# Patient Record
Sex: Male | Born: 1955 | Race: White | Hispanic: No | Marital: Married | Smoking: Former smoker
Health system: Southern US, Community
[De-identification: ages and names within clinical notes are randomized; demographics above are authoritative.]

## PROBLEM LIST (undated history)

## (undated) HISTORY — PX: HERNIA REPAIR: SHX51

---

## 2008-04-16 ENCOUNTER — Ambulatory Visit: Payer: Self-pay | Admitting: Unknown Physician Specialty

## 2011-10-01 ENCOUNTER — Ambulatory Visit: Payer: Self-pay | Admitting: Surgery

## 2014-07-21 NOTE — Op Note (Signed)
PATIENT NAME:  Jesse Cameron, Jesse Cameron MR#:  [id number] DATE OF BIRTH:  06-12-1955  DATE OF PROCEDURE:  10/01/2011  PREOPERATIVE DIAGNOSIS: Left inguinal hernia.   POSTOPERATIVE DIAGNOSIS: Left inguinal hernia.   PROCEDURE: Left inguinal hernia repair.   SURGEON: Rochel Brome, M.D.   ANESTHESIA: General.   INDICATIONS: This 59 year old male has had recent pain and bulging in the left groin. A left inguinal hernia was demonstrated on physical exam and repair was recommended for definitive treatment.   DESCRIPTION OF PROCEDURE: The patient was placed on the operating table in the supine position under general anesthesia. The left groin was clipped and prepared with ChloraPrep and draped in a sterile manner.   A transversely oriented left suprapubic incision was made and carried down through subcutaneous tissues. One traversing vein was divided and ligated with 4-0 Vicryl. Scarpa's fascia was incised. Several small bleeding points were cauterized. The external ring was identified. The external oblique aponeurosis was incised along the course of its fibers to open the external ring and expose the inguinal cord structures. These structures and ilioinguinal nerve were mobilized with blunt finger dissection and a Penrose drain passed around the cord structures. The floor of the inguinal canal appeared to be slightly weakened. Cremaster fibers were spread to expose an indirect hernia sac which was some 2 inches in length and was dissected free from surrounding structures up to the internal ring. The sac was opened. There was attachment of the sigmoid colon to the inner aspect of the sac and this attachment was divided and the colon reduced. Next, a high ligation of the sac was done with a 0 Surgilon. The sac was excised and did not need to be sent for pathology. The floor of the inguinal canal was repaired with a row of 0 Surgilon sutures beginning at the pubic tubercle and suturing the conjoined tendon to the  shelving edge of the inguinal ligament incorporating transversalis fascia into the repair. The last stitch led to satisfactory narrowing of the internal ring. Next, an onlay Atrium mesh was cut and created an oval shape of some 2.5 x 3.5 cm in dimension. A notch was cut out to straddle the internal ring. The mesh was sewn to the repair and was also sewn medially to the fascia and on both sides of the internal ring. The repair looked good. Hemostasis was intact. The cord structures and ilioinguinal nerve were replaced along the floor of the inguinal canal. Cut edges of the external oblique aponeurosis were closed with a running 4-0 Vicryl. The deep fascia superior and lateral to the repair site was infiltrated with 0.5% Sensorcaine with epinephrine. Subcutaneous tissues were infiltrated as well. The Scarpa's fascia was closed with interrupted 4-0 Vicryl. The skin was closed with running 5-0 Monocryl subcuticular suture and Dermabond. The patient tolerated surgery satisfactorily and was then prepared for transfer to the recovery room. ____________________________ Lenna Sciara. Rochel Brome, MD jws:slb D: 10/01/2011 09:36:58 ET T: 10/01/2011 14:29:13 ET JOB#: [id number]  cc: Loreli Dollar, MD, <Dictator> Loreli Dollar MD ELECTRONICALLY SIGNED 10/02/2011 11:08

## 2014-10-04 ENCOUNTER — Encounter: Payer: Self-pay | Admitting: *Deleted

## 2014-10-07 ENCOUNTER — Encounter
Admission: RE | Disposition: A | Discharge status: Home / Self Care | Payer: Self-pay | Source: Ambulatory Visit | Attending: Unknown Physician Specialty

## 2014-10-07 ENCOUNTER — Ambulatory Visit
Admission: RE | Admit: 2014-10-07 | Discharge: 2014-10-07 | Disposition: A | Discharge status: Home / Self Care | Payer: PRIVATE HEALTH INSURANCE | Source: Ambulatory Visit | Attending: Unknown Physician Specialty | Admitting: Unknown Physician Specialty

## 2014-10-07 ENCOUNTER — Ambulatory Visit: Payer: PRIVATE HEALTH INSURANCE | Admitting: Anesthesiology

## 2014-10-07 DIAGNOSIS — Z86010 Personal history of colonic polyps: Secondary | ICD-10-CM | POA: Insufficient documentation

## 2014-10-07 DIAGNOSIS — D123 Benign neoplasm of transverse colon: Secondary | ICD-10-CM | POA: Insufficient documentation

## 2014-10-07 DIAGNOSIS — K5730 Diverticulosis of large intestine without perforation or abscess without bleeding: Secondary | ICD-10-CM | POA: Insufficient documentation

## 2014-10-07 DIAGNOSIS — Z7982 Long term (current) use of aspirin: Secondary | ICD-10-CM | POA: Insufficient documentation

## 2014-10-07 DIAGNOSIS — Z79899 Other long term (current) drug therapy: Secondary | ICD-10-CM | POA: Insufficient documentation

## 2014-10-07 DIAGNOSIS — Z87891 Personal history of nicotine dependence: Secondary | ICD-10-CM | POA: Insufficient documentation

## 2014-10-07 DIAGNOSIS — K648 Other hemorrhoids: Secondary | ICD-10-CM | POA: Insufficient documentation

## 2014-10-07 HISTORY — PX: COLONOSCOPY WITH PROPOFOL: SHX5780

## 2014-10-07 SURGERY — COLONOSCOPY WITH PROPOFOL
Anesthesia: General

## 2014-10-07 MED ORDER — PROPOFOL INFUSION 10 MG/ML OPTIME
INTRAVENOUS | Status: AC | PRN
  Administered 2014-10-07: 140 ug/kg/min via INTRAVENOUS

## 2014-10-07 MED ORDER — MIDAZOLAM HCL 2 MG/2ML IJ SOLN
INTRAMUSCULAR | Status: AC | PRN
  Administered 2014-10-07: 1 mg via INTRAVENOUS

## 2014-10-07 MED ORDER — FENTANYL CITRATE (PF) 100 MCG/2ML IJ SOLN
INTRAMUSCULAR | Status: AC | PRN
Start: 2014-10-07 — End: ?
  Administered 2014-10-07: 50 ug via INTRAVENOUS

## 2014-10-07 MED ORDER — SODIUM CHLORIDE 0.9 % IV SOLN
INTRAVENOUS | Status: DC
  Administered 2014-10-07: 1000 mL via INTRAVENOUS

## 2014-10-07 MED ORDER — EPHEDRINE SULFATE 50 MG/ML IJ SOLN
INTRAMUSCULAR | Status: AC | PRN
  Administered 2014-10-07: 5 mg via INTRAVENOUS

## 2014-10-07 MED ORDER — SODIUM CHLORIDE 0.9 % IV SOLN: INTRAVENOUS | Status: DC

## 2014-10-07 NOTE — H&P (Signed)
Primary Care Physician:  Rusty Aus., MD Primary Gastroenterologist:  Dr. Vira Agar  Pre-Procedure History & Physical: HPI:  Jesse Cameron is a 59 y.o. male is here for an colonoscopy.   History reviewed. No pertinent past medical history.  Past Surgical History  Procedure Laterality Date  . Hernia repair      Prior to Admission medications   Medication Sig Start Date End Date Taking? Authorizing Provider  aspirin 81 MG tablet Take 81 mg by mouth daily.   Yes Historical Provider, MD  sildenafil (REVATIO) 20 MG tablet Take 20 mg by mouth 3 (three) times daily.   Yes Historical Provider, MD  valACYclovir (VALTREX) 500 MG tablet Take 500 mg by mouth 2 (two) times daily.   Yes Historical Provider, MD    Allergies as of 10/04/2014  . (No Known Allergies)    History reviewed. No pertinent family history.  History   Social History  . Marital Status: Married    Spouse Name: N/A  . Number of Children: N/A  . Years of Education: N/A   Occupational History  . Not on file.   Social History Main Topics  . Smoking status: Former Research scientist (life sciences)  . Smokeless tobacco: Not on file  . Alcohol Use: Not on file  . Drug Use: Not on file  . Sexual Activity: Not on file   Other Topics Concern  . Not on file   Social History Narrative    Review of Systems: See HPI, otherwise negative ROS  Physical Exam: BP 123/90 mmHg  Pulse 78  Temp(Src) 96.9 F (36.1 C) (Tympanic)  Resp 17  Ht 6\' 5"  (1.956 m)  Wt 97.523 kg (215 lb)  BMI 25.49 kg/m2  SpO2 97% General:   Alert,  pleasant and cooperative in NAD Head:  Normocephalic and atraumatic. Neck:  Supple; no masses or thyromegaly. Lungs:  Clear throughout to auscultation.    Heart:  Regular rate and rhythm. Abdomen:  Soft, nontender and nondistended. Normal bowel sounds, without guarding, and without rebound.   Neurologic:  Alert and  oriented x4;  grossly normal neurologically.  Impression/Plan: Jesse Cameron is here for an  colonoscopy to be performed for personal history of colon polyps  Risks, benefits, limitations, and alternatives regarding  colonoscopy have been reviewed with the patient.  Questions have been answered.  All parties agreeable.   Gaylyn Cheers, MD  10/07/2014, 8:13 AM   Primary Care Physician:  Rusty Aus., MD Primary Gastroenterologist:  Dr. Vira Agar  Pre-Procedure History & Physical: HPI:  Jesse Cameron is a 59 y.o. male is here for an colonoscopy.   History reviewed. No pertinent past medical history.  Past Surgical History  Procedure Laterality Date  . Hernia repair      Prior to Admission medications   Medication Sig Start Date End Date Taking? Authorizing Provider  aspirin 81 MG tablet Take 81 mg by mouth daily.   Yes Historical Provider, MD  sildenafil (REVATIO) 20 MG tablet Take 20 mg by mouth 3 (three) times daily.   Yes Historical Provider, MD  valACYclovir (VALTREX) 500 MG tablet Take 500 mg by mouth 2 (two) times daily.   Yes Historical Provider, MD    Allergies as of 10/04/2014  . (No Known Allergies)    History reviewed. No pertinent family history.  History   Social History  . Marital Status: Married    Spouse Name: N/A  . Number of Children: N/A  . Years of Education: N/A   Occupational  History  . Not on file.   Social History Main Topics  . Smoking status: Former Research scientist (life sciences)  . Smokeless tobacco: Not on file  . Alcohol Use: Not on file  . Drug Use: Not on file  . Sexual Activity: Not on file   Other Topics Concern  . Not on file   Social History Narrative    Review of Systems: See HPI, otherwise negative ROS  Physical Exam: BP 123/90 mmHg  Pulse 78  Temp(Src) 96.9 F (36.1 C) (Tympanic)  Resp 17  Ht 6\' 5"  (1.956 m)  Wt 97.523 kg (215 lb)  BMI 25.49 kg/m2  SpO2 97% General:   Alert,  pleasant and cooperative in NAD Head:  Normocephalic and atraumatic. Neck:  Supple; no masses or thyromegaly. Lungs:  Clear throughout to  auscultation.    Heart:  Regular rate and rhythm. Abdomen:  Soft, nontender and nondistended. Normal bowel sounds, without guarding, and without rebound.   Neurologic:  Alert and  oriented x4;  grossly normal neurologically.  Impression/Plan: Jesse Cameron is here for an colonoscopy to be performed for personal history of colon polyps  Risks, benefits, limitations, and alternatives regarding  colonoscopy have been reviewed with the patient.  Questions have been answered.  All parties agreeable.   Gaylyn Cheers, MD  10/07/2014, 8:13 AM

## 2014-10-07 NOTE — Anesthesia Procedure Notes (Signed)
Performed by: COOK-MARTIN, Axxel Gude Pre-anesthesia Checklist: Patient identified, Emergency Drugs available, Suction available, Patient being monitored and Timeout performed Patient Re-evaluated:Patient Re-evaluated prior to inductionOxygen Delivery Method: Nasal cannula Preoxygenation: Pre-oxygenation with 100% oxygen Intubation Type: IV induction     

## 2014-10-07 NOTE — Transfer of Care (Signed)
Immediate Anesthesia Transfer of Care Note  Patient: Jesse Cameron  Procedure(s) Performed: Procedure(s): COLONOSCOPY WITH PROPOFOL (N/A)  Patient Location: PACU  Anesthesia Type:General  Level of Consciousness: awake, alert  and oriented  Airway & Oxygen Therapy: Patient Spontanous Breathing and Patient connected to nasal cannula oxygen  Post-op Assessment: Report given to RN and Post -op Vital signs reviewed and stable  Post vital signs: rreviewed and stable  Last Vitals:  Filed Vitals:   10/07/14 0727  BP: 123/90  Pulse: 78  Temp: 36.1 C  Resp: 17    Complications: No apparent anesthesia complications

## 2014-10-07 NOTE — Op Note (Signed)
[facility] Gastroenterology Patient Name: Jesse Cameron Procedure Date: 10/07/2014 8:14 AM MRN: [id number] Account #: [id number] Date of Birth: 1955/09/06 Admit Type: Outpatient Age: 59 Room: [facility] ENDO ROOM 1 Gender: Male Note Status: Finalized Procedure:         Colonoscopy Indications:       Personal history of colonic polyps Providers:         Manya Silvas, MD Referring MD:      Rusty Aus, MD (Referring MD) Medicines:         Propofol per Anesthesia Complications:     No immediate complications. Procedure:         Pre-Anesthesia Assessment:                    - After reviewing the risks and benefits, the patient was                     deemed in satisfactory condition to undergo the procedure.                    After obtaining informed consent, the colonoscope was                     passed under direct vision. Throughout the procedure, the                     patient's blood pressure, pulse, and oxygen saturations                     were monitored continuously. The Colonoscope was                     introduced through the anus and advanced to the the cecum,                     identified by appendiceal orifice and ileocecal valve. The                     colonoscopy was performed without difficulty. The patient                     tolerated the procedure well. The quality of the bowel                     preparation was good. Findings:      A small polyp was found in the transverse colon. The polyp was sessile.       The polyp was removed with a hot snare. Resection was complete, but the       polyp tissue was not retrieved.      A diminutive polyp was found in the transverse colon. The polyp was       sessile. The polyp was removed with a jumbo cold forceps. Resection and       retrieval were complete.      A few small-mouthed diverticula were found in the descending colon.      Internal hemorrhoids were found during endoscopy. The  hemorrhoids were       medium-sized and Grade I (internal hemorrhoids that do not prolapse).      The exam was otherwise without abnormality. Impression:        - One small polyp in the transverse colon. Complete  resection. Polyp tissue not retrieved.                    - One diminutive polyp in the transverse colon. Resected                     and retrieved.                    - Diverticulosis in the descending colon.                    - Internal hemorrhoids.                    - The examination was otherwise normal. Recommendation:    - Await pathology results. Manya Silvas, MD 10/07/2014 8:43:49 AM This report has been signed electronically. Number of Addenda: 0 Note Initiated On: 10/07/2014 8:14 AM Scope Withdrawal Time: 0 hours 10 minutes 31 seconds  Total Procedure Duration: 0 hours 22 minutes 1 second       [facility]

## 2014-10-07 NOTE — Anesthesia Postprocedure Evaluation (Signed)
  Anesthesia Post-op Note  Patient: Jesse Cameron  Procedure(s) Performed: Procedure(s): COLONOSCOPY WITH PROPOFOL (N/A)  Anesthesia type:General  Patient location: PACU  Post pain: Pain level controlled  Post assessment: Post-op Vital signs reviewed, Patient's Cardiovascular Status Stable, Respiratory Function Stable, Patent Airway and No signs of Nausea or vomiting  Post vital signs: Reviewed and stable  Last Vitals:  Filed Vitals:   10/07/14 0845  BP:   Pulse:   Temp: 36.4 C  Resp:     Level of consciousness: awake, alert  and patient cooperative  Complications: No apparent anesthesia complications

## 2014-10-07 NOTE — Anesthesia Preprocedure Evaluation (Signed)
Anesthesia Evaluation  Patient identified by MRN, date of birth, ID band Patient awake    Reviewed: Allergy & Precautions, NPO status , Patient's Chart, lab work & pertinent test results  History of Anesthesia Complications Negative for: history of anesthetic complications  Airway Mallampati: II       Dental no notable dental hx.    Pulmonary former smoker,    + decreased breath sounds      Cardiovascular negative cardio ROS Normal cardiovascular exam    Neuro/Psych negative neurological ROS  negative psych ROS   GI/Hepatic negative GI ROS, Neg liver ROS,   Endo/Other  negative endocrine ROS  Renal/GU negative Renal ROS  negative genitourinary   Musculoskeletal negative musculoskeletal ROS (+)   Abdominal Normal abdominal exam  (+)   Peds negative pediatric ROS (+)  Hematology negative hematology ROS (+)   Anesthesia Other Findings   Reproductive/Obstetrics negative OB ROS                             Anesthesia Physical Anesthesia Plan  ASA: II  Anesthesia Plan: General   Post-op Pain Management:    Induction: Intravenous  Airway Management Planned: Nasal Cannula  Additional Equipment:   Intra-op Plan:   Post-operative Plan:   Informed Consent: I have reviewed the patients History and Physical, chart, labs and discussed the procedure including the risks, benefits and alternatives for the proposed anesthesia with the patient or authorized representative who has indicated his/her understanding and acceptance.     Plan Discussed with: CRNA  Anesthesia Plan Comments:         Anesthesia Quick Evaluation

## 2014-10-08 ENCOUNTER — Encounter: Payer: Self-pay | Admitting: Unknown Physician Specialty

## 2014-10-09 LAB — SURGICAL PATHOLOGY

## 2020-10-22 ENCOUNTER — Other Ambulatory Visit: Payer: Self-pay | Admitting: Internal Medicine

## 2020-10-22 ENCOUNTER — Ambulatory Visit
Admission: RE | Admit: 2020-10-22 | Discharge: 2020-10-22 | Disposition: A | Discharge status: Home / Self Care | Payer: 59 | Source: Ambulatory Visit | Attending: Family Medicine | Admitting: Family Medicine

## 2020-10-22 ENCOUNTER — Other Ambulatory Visit (HOSPITAL_COMMUNITY): Payer: Self-pay | Admitting: Internal Medicine

## 2020-10-22 ENCOUNTER — Other Ambulatory Visit: Payer: Self-pay | Admitting: Family Medicine

## 2020-10-22 ENCOUNTER — Other Ambulatory Visit: Payer: Self-pay

## 2020-10-22 DIAGNOSIS — M79604 Pain in right leg: Secondary | ICD-10-CM

## 2020-10-22 DIAGNOSIS — R1031 Right lower quadrant pain: Secondary | ICD-10-CM

## 2020-10-22 DIAGNOSIS — I83891 Varicose veins of right lower extremities with other complications: Secondary | ICD-10-CM

## 2020-10-23 ENCOUNTER — Other Ambulatory Visit: Payer: Self-pay | Admitting: Internal Medicine

## 2020-10-23 DIAGNOSIS — R1031 Right lower quadrant pain: Secondary | ICD-10-CM

## 2020-10-31 ENCOUNTER — Ambulatory Visit: Payer: 59

## 2020-11-06 ENCOUNTER — Other Ambulatory Visit: Payer: Self-pay

## 2020-11-06 ENCOUNTER — Ambulatory Visit
Admission: RE | Admit: 2020-11-06 | Discharge: 2020-11-06 | Disposition: A | Discharge status: Home / Self Care | Payer: 59 | Source: Ambulatory Visit | Attending: Internal Medicine | Admitting: Internal Medicine

## 2020-11-06 DIAGNOSIS — R1031 Right lower quadrant pain: Secondary | ICD-10-CM | POA: Insufficient documentation

## 2020-11-06 MED ORDER — IOHEXOL 350 MG/ML SOLN
100.0000 mL | Freq: Once | INTRAVENOUS | Status: AC | PRN
  Administered 2020-11-06: 100 mL via INTRAVENOUS

## 2022-04-06 DIAGNOSIS — Z0000 Encounter for general adult medical examination without abnormal findings: Secondary | ICD-10-CM | POA: Diagnosis not present

## 2022-04-06 DIAGNOSIS — Z125 Encounter for screening for malignant neoplasm of prostate: Secondary | ICD-10-CM | POA: Diagnosis not present

## 2022-11-09 IMAGING — US US EXTREM LOW VENOUS*R*
1 series · 13 of 24 positions shown · non-contrast
Comparison: None.

CLINICAL DATA: Right lower extremity pain.



[Series 1: us venous img lower uni right (dvt) · portal-venous · 49 acquisitions, 13 frames shown]
[im 1/49]
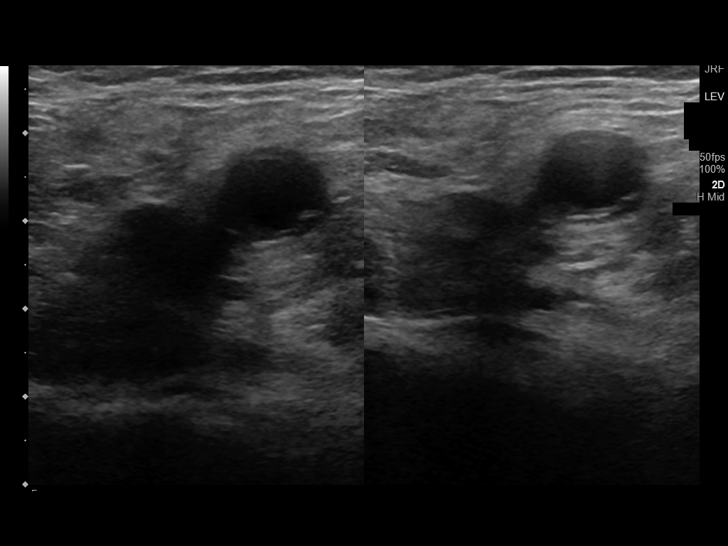
[im 5/49]
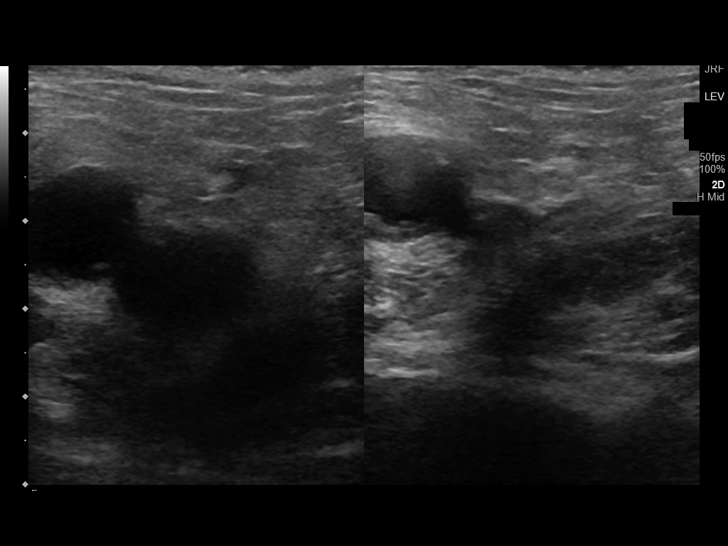
[im 9/49]
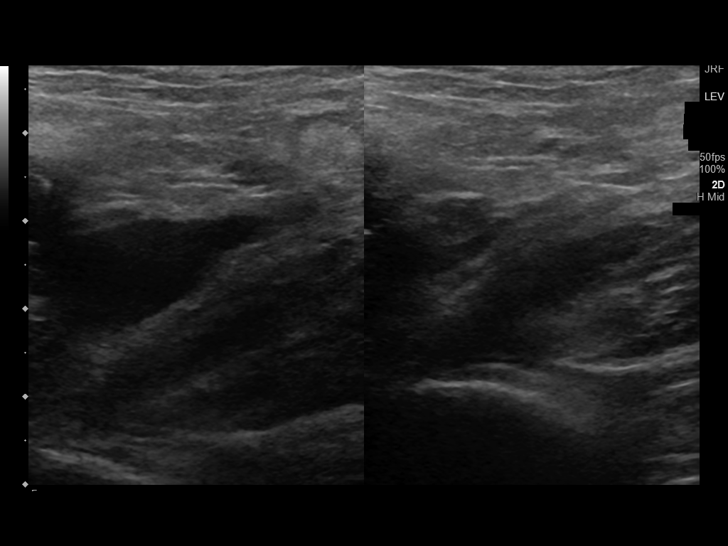
[im 13/49]
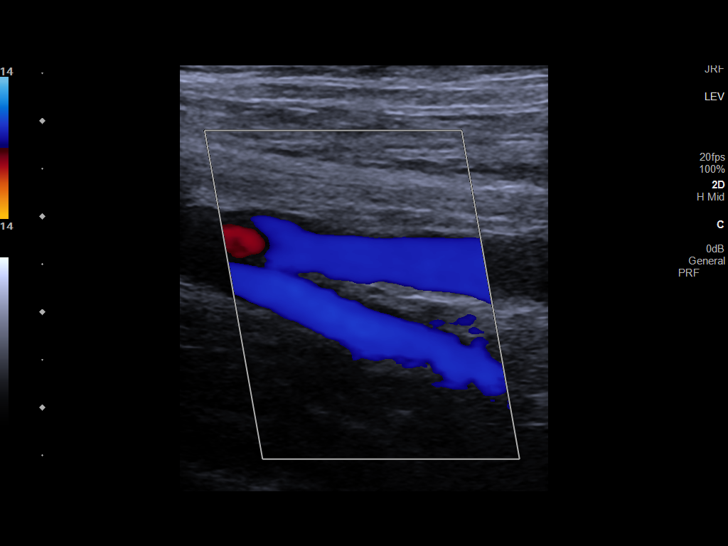
[im 17/49]
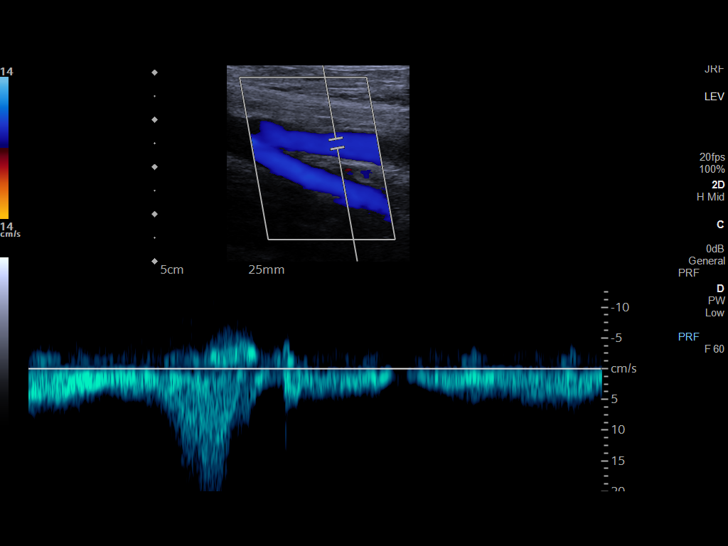
[im 21/49]
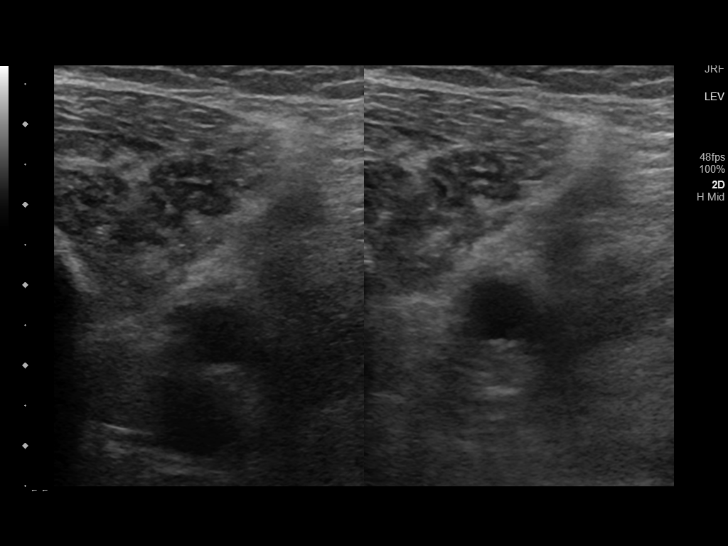
[im 28/49]
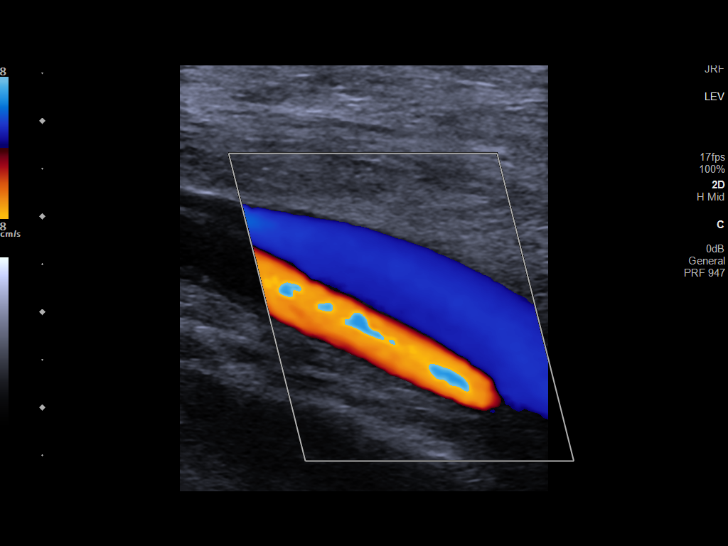
[im 30/49]
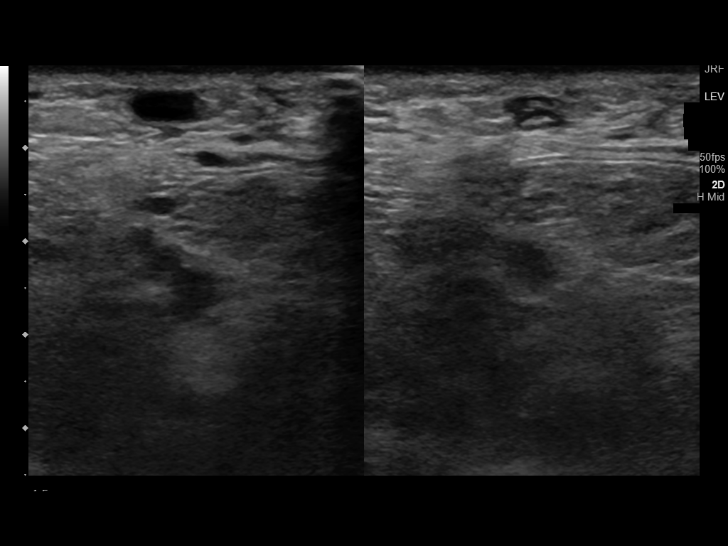
[im 34/49]
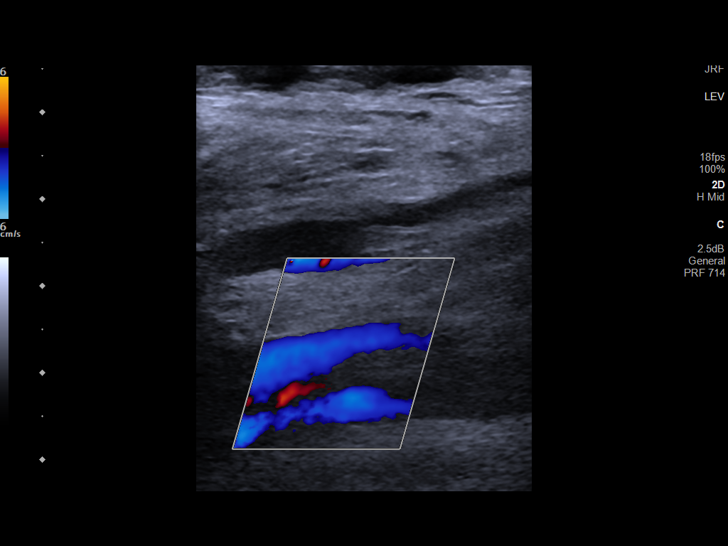
[im 38/49]
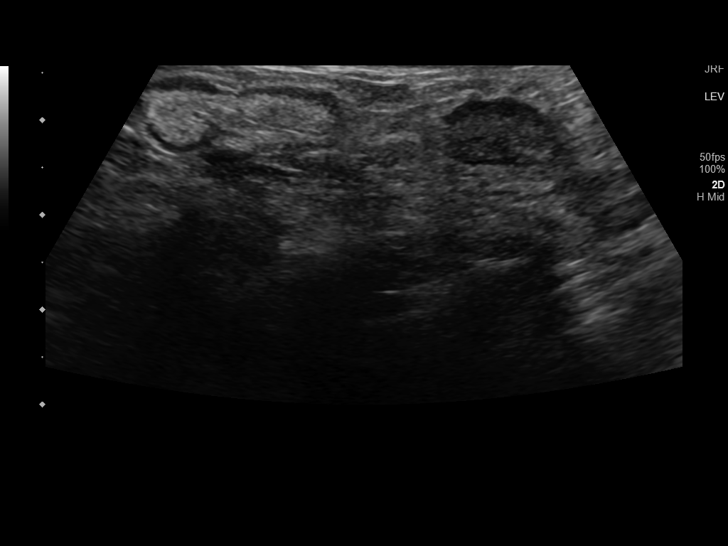
[im 42/49]
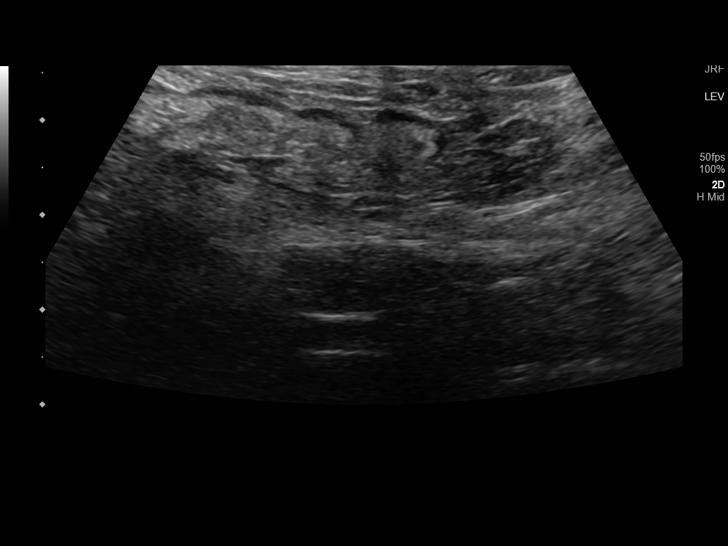
[im 46/49]
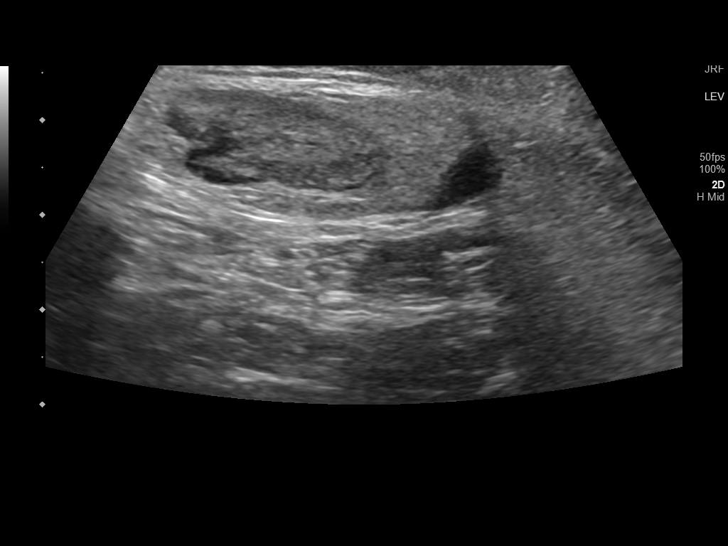
[im 49/49]
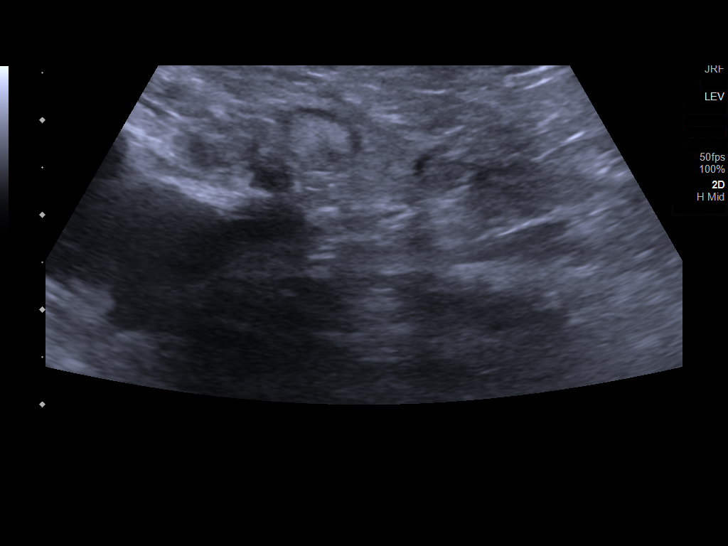

[13 of 24 positions shown; findings below may reference images not displayed]

FINDINGS: Contralateral Common Femoral Vein: Respiratory phasicity is normal
and symmetric with the symptomatic side. No evidence of thrombus.
Normal compressibility.

Common Femoral Vein: No evidence of thrombus. Normal
compressibility, respiratory phasicity and response to augmentation.

Saphenofemoral Junction: No evidence of thrombus. Normal
compressibility and flow on color Doppler imaging.

Profunda Femoral Vein: No evidence of thrombus. Normal
compressibility and flow on color Doppler imaging.

Femoral Vein: No evidence of thrombus. Normal compressibility,
respiratory phasicity and response to augmentation.

Popliteal Vein: No evidence of thrombus. Normal compressibility,
respiratory phasicity and response to augmentation.

Calf Veins: No evidence of thrombus. Normal compressibility and flow
on color Doppler imaging.

Superficial Great Saphenous Vein: No evidence of thrombus. Normal
compressibility.

Venous Reflux:  None.

Other Findings: No evidence of superficial thrombophlebitis or
abnormal fluid collection. Mildly prominent inguinal lymph nodes on
the right may be reactive. The largest measures approximately 1.5 cm
in short axis.
IMPRESSION: No evidence of right lower extremity deep venous thrombosis.

## 2022-11-24 IMAGING — CT CT ABD-PELV W/ CM
2 of 5 series · 16 of 46 positions shown, 18 images · IV contrast (omnipaque)
Comparison: None

CLINICAL DATA: RIGHT lower leg pain with redness and swelling for 1
month, prior LEFT inguinal hernia repair, history ethanol use, no
DVT by ultrasound

EXAM:
CT ABDOMEN AND PELVIS WITH CONTRAST
TECHNIQUE: Multidetector CT imaging of the abdomen and pelvis was performed
using the standard protocol following bolus administration of
intravenous contrast. Sagittal and coronal MPR images reconstructed
from axial data set.
CONTRAST:  100mL OMNIPAQUE IOHEXOL 350 MG/ML SOLN IV. Dilute oral
contrast.

[Series 2: abd pelvis 5.00 · axial · 0.77mm/px · z∈[-1563,-1123]mm · 13 of 100 slices shown, 15 images]
[im 6/100  soft-tissue]
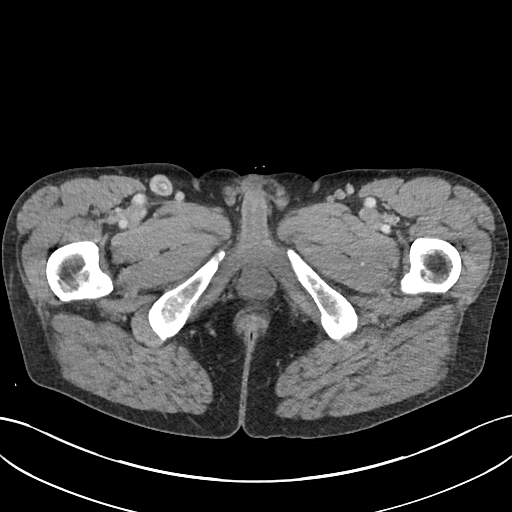
[im 6/100  bone]
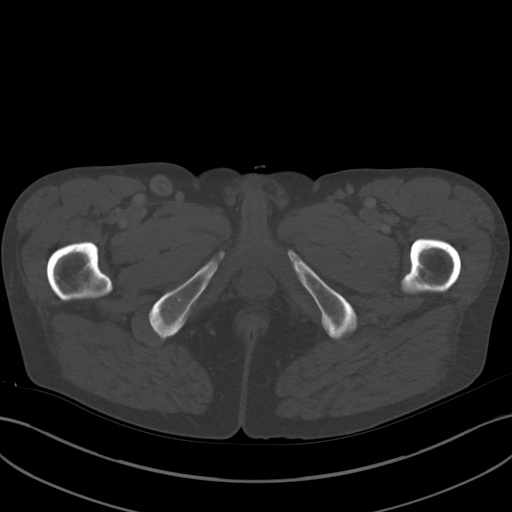
[im 12/100  soft-tissue]
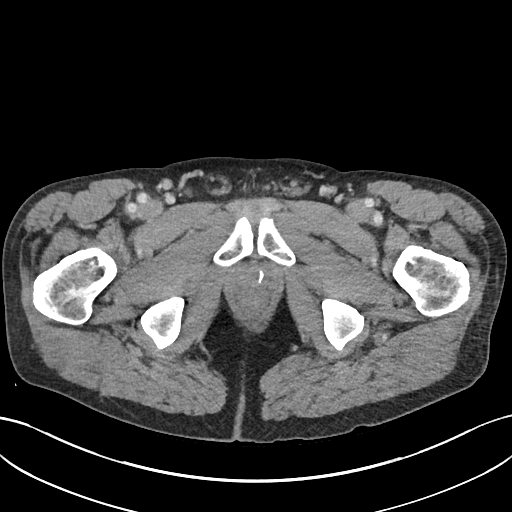
[im 24/100  soft-tissue]
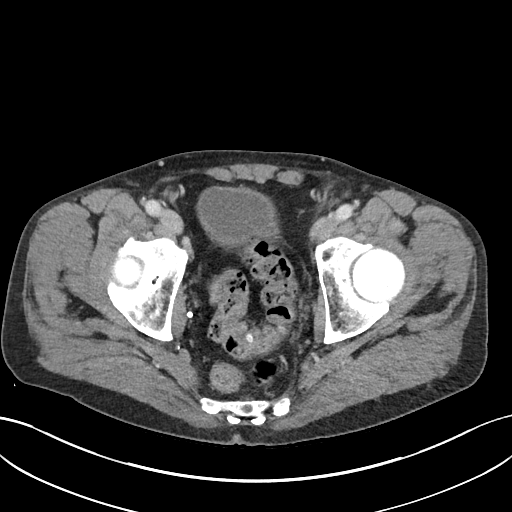
[im 30/100  soft-tissue]
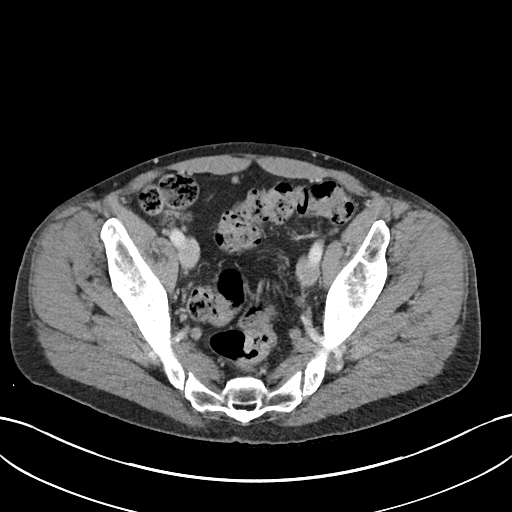
[im 35/100  soft-tissue]
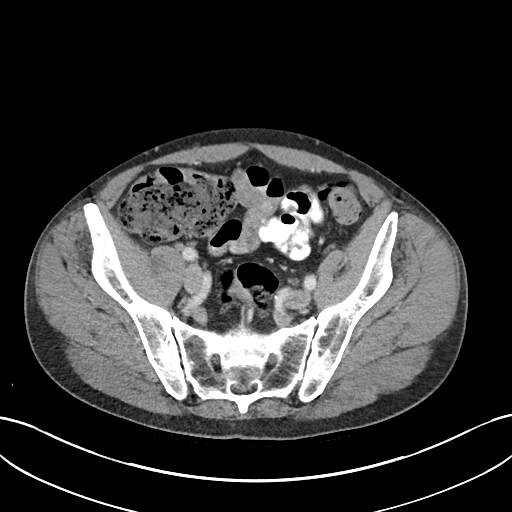
[im 41/100  soft-tissue]
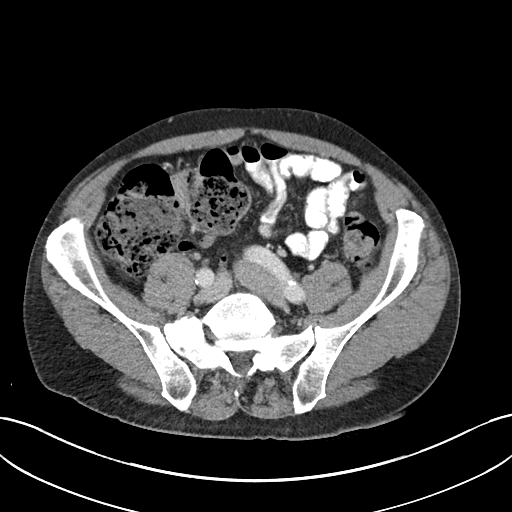
[im 53/100  soft-tissue]
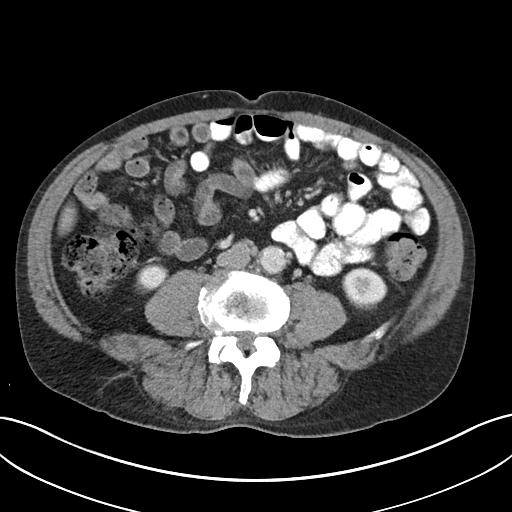
[im 59/100  soft-tissue]
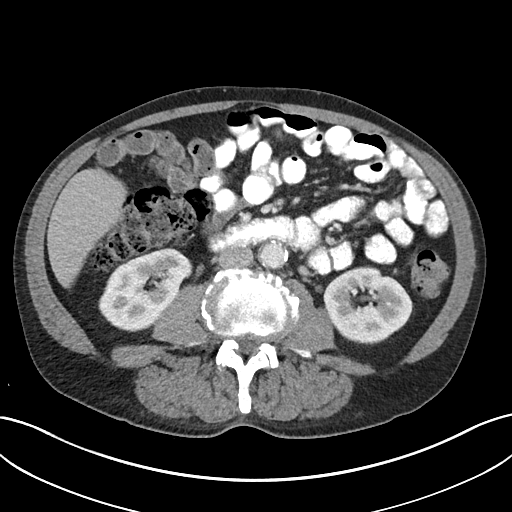
[im 65/100  soft-tissue]
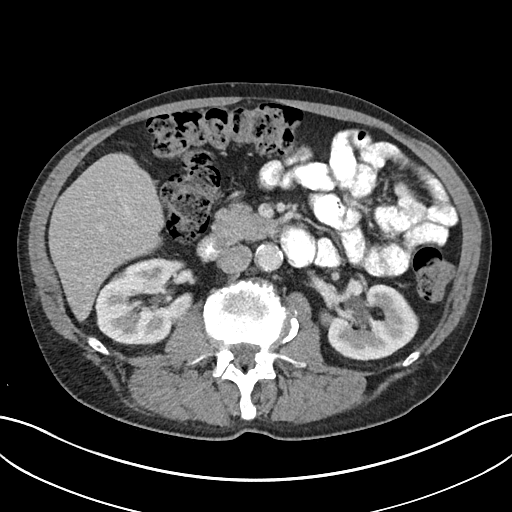
[im 65/100  bone]
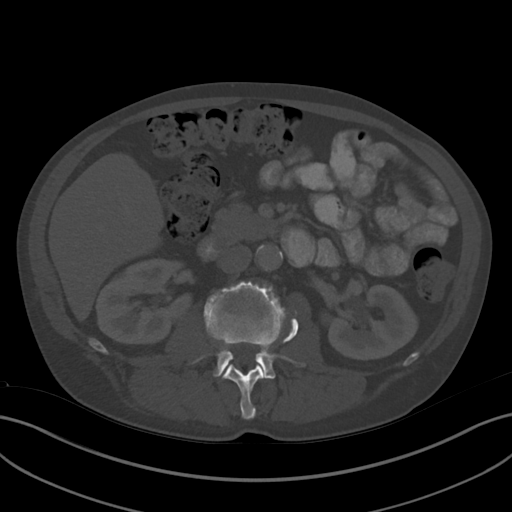
[im 70/100  soft-tissue]
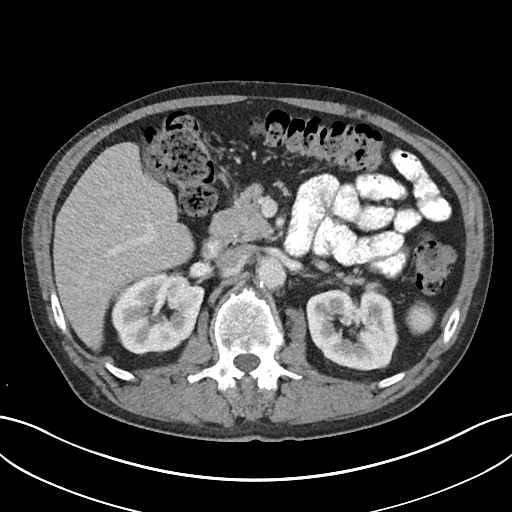
[im 76/100  soft-tissue]
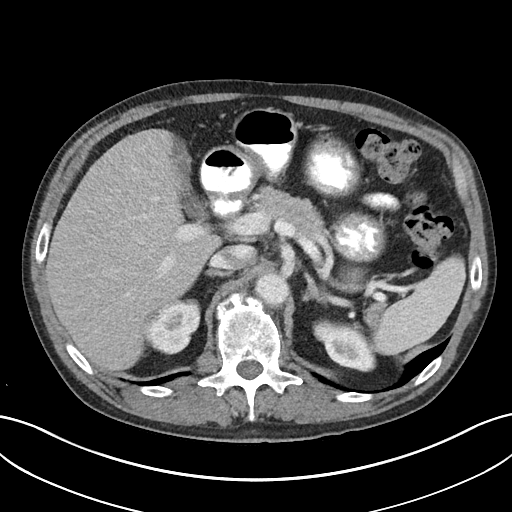
[im 88/100  soft-tissue]
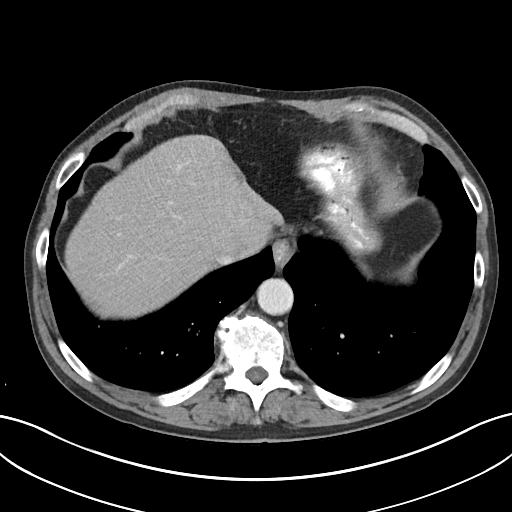
[im 94/100  soft-tissue]
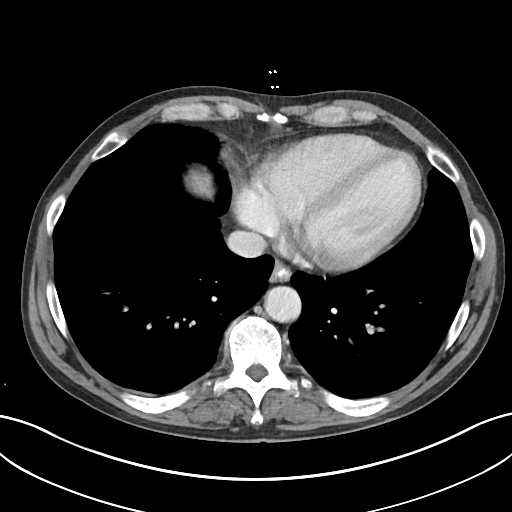

[Series 4: coronals abd pelvis 2.00 cor · coronal · 0.77mm/px · 3 of 145 slices shown]
[im 49/145  soft-tissue]
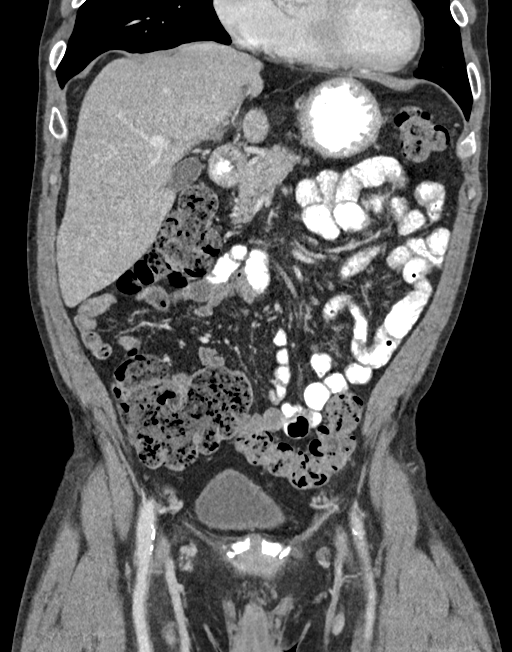
[im 65/145  soft-tissue]
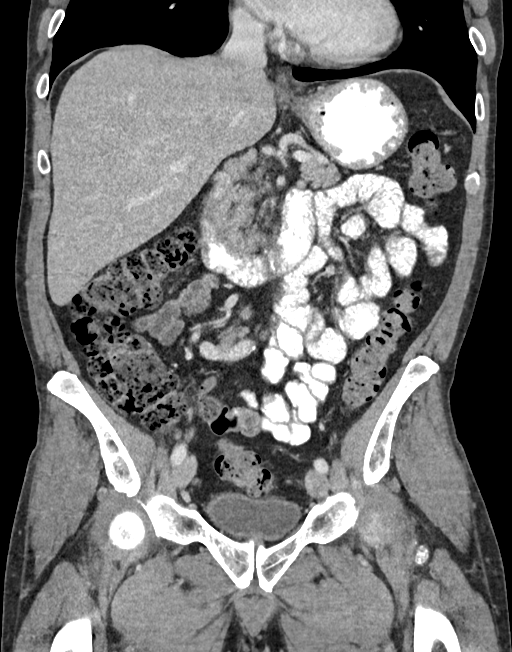
[im 81/145  soft-tissue]
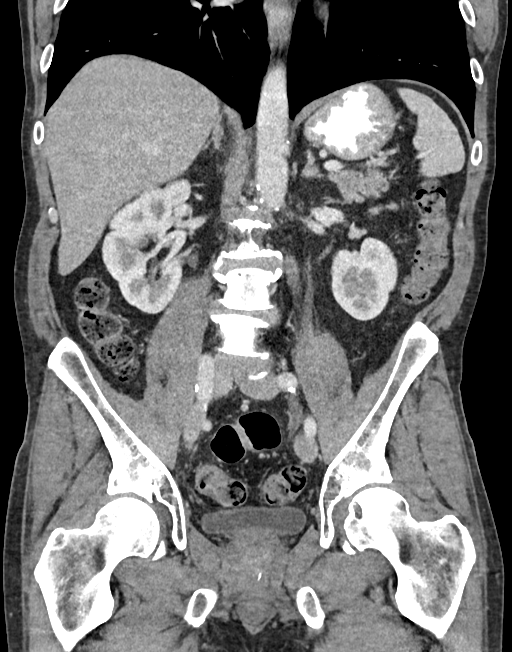

[16 of 46 positions shown; findings below may reference images not displayed]

FINDINGS: Lower chest: Lung bases clear

Hepatobiliary: Gallbladder and liver normal appearance

Pancreas: Normal appearance

Spleen: Normal appearance.  Tiny splenule.

Adrenals/Urinary Tract: Adrenal glands, kidneys, ureters, and
bladder normal appearance

Stomach/Bowel: Normal appendix. Stool throughout colon. Minimal
sigmoid diverticulosis. Stomach unremarkable. Bowel loops otherwise
normal appearance.

Vascular/Lymphatic: Atherosclerotic calcifications aorta, iliac
arteries, femoral arteries. Aorta normal caliber. Single mildly
enlarged lymph node at RIGHT inguinal region 1.6 cm short axis.

Reproductive: Prominent seminal vesicles. Prostate gland
unremarkable.

Other: No free air or free fluid. No definite hernia or infant acute
inflammatory process.

Musculoskeletal: Degenerative disc disease changes lumbar spine.
IMPRESSION: No acute intra-abdominal or intrapelvic abnormalities.

Minimal sigmoid diverticulosis.

Single mildly enlarged RIGHT inguinal lymph node 1.6 cm short axis,
nonspecific.

Aortic Atherosclerosis ([id number]).

## 2023-12-05 ENCOUNTER — Other Ambulatory Visit (INDEPENDENT_AMBULATORY_CARE_PROVIDER_SITE_OTHER): Payer: Self-pay | Admitting: Nurse Practitioner

## 2023-12-05 DIAGNOSIS — I83891 Varicose veins of right lower extremities with other complications: Secondary | ICD-10-CM

## 2023-12-07 ENCOUNTER — Encounter (INDEPENDENT_AMBULATORY_CARE_PROVIDER_SITE_OTHER): Payer: Self-pay | Admitting: Nurse Practitioner

## 2023-12-07 ENCOUNTER — Ambulatory Visit (INDEPENDENT_AMBULATORY_CARE_PROVIDER_SITE_OTHER): Payer: Self-pay | Admitting: Nurse Practitioner

## 2023-12-07 ENCOUNTER — Other Ambulatory Visit (INDEPENDENT_AMBULATORY_CARE_PROVIDER_SITE_OTHER): Payer: Self-pay

## 2023-12-07 VITALS — BP 131/84 | HR 75 | Ht 77.0 in | Wt 204.8 lb

## 2023-12-07 DIAGNOSIS — I83891 Varicose veins of right lower extremities with other complications: Secondary | ICD-10-CM | POA: Diagnosis not present

## 2023-12-07 DIAGNOSIS — L97312 Non-pressure chronic ulcer of right ankle with fat layer exposed: Secondary | ICD-10-CM

## 2023-12-07 DIAGNOSIS — I83013 Varicose veins of right lower extremity with ulcer of ankle: Secondary | ICD-10-CM

## 2023-12-07 NOTE — Progress Notes (Signed)
 Subjective:    Patient ID: Jesse Cameron, male    DOB: Jul 30, 1955, 68 y.o.   MRN: [id number] Chief Complaint  Patient presents with   New Patient (Initial Visit)     consult. Varicose veins of right lower ext    Patient is seen for evaluation of leg pain and swelling associated with new onset ulceration. The patient first noticed the swelling remotely. The swelling is associated with pain and discoloration. The pain and swelling worsens with prolonged dependency and improves with elevation. The pain is unrelated to activity.  The patient notes that in the morning the legs are better but the leg symptoms worsened throughout the course of the day. The patient has also noted a progressive worsening of the discoloration in the ankle and shin area.   The patient notes that an ulcer has developed acutely without specific trauma and since it occurred it has been very slow to heal.  There is a moderate amount of drainage associated with the open area.  The wound is also very painful.  The patient denies claudication symptoms or rest pain symptoms.   The patient has not had any past angiography, interventions or vascular surgery.   Elevation makes the leg symptoms better, dependency makes them much worse. The patient denies any recent changes in medications.  The patient has been in [employer] since 09/30/2023  The patient denies a history of DVT or PE. There is no prior history of phlebitis. There is no history of primary lymphedema.  No history of malignancies. No history of trauma or groin or pelvic surgery. There is no history of radiation treatment to the groin or pelvis    Today non invasive studies indicate no evidence of DVT in the right lower extremity.  There is no chronic venous insufficiency noted.  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       Review  of Systems  Skin:  Positive for wound.  All other systems reviewed and are negative.      Objective:   Physical Exam Vitals reviewed.  HENT:     Head: Normocephalic.  Cardiovascular:     Rate and Rhythm: Normal rate.     Pulses: Normal pulses.  Pulmonary:     Effort: Pulmonary effort is normal.  Musculoskeletal:     Right lower leg: Edema present.  Skin:    General: Skin is warm and dry.  Neurological:     Mental Status: He is alert and oriented to person, place, and time.  Psychiatric:        Mood and Affect: Mood normal.        Behavior: Behavior normal.        Thought Content: Thought content normal.        Judgment: Judgment normal.     BP 131/84   Pulse 75   Ht 6' 5 (1.956 m)   Wt 204 lb 12.8 oz (92.9 kg)   BMI 24.29 kg/m   History reviewed. No pertinent past medical history.  Social History   Socioeconomic History   Marital status: Married    Spouse name: Not on file   Number of children: Not on file   Years of education: Not on file   Highest education level: Not on file  Occupational History   Not on file  Tobacco Use   Smoking status: Former   Smokeless  tobacco: Not on file  Substance and Sexual Activity   Alcohol use: Yes    Comment: weekends   Drug use: Not on file   Sexual activity: Not on file  Other Topics Concern   Not on file  Social History Narrative   Not on file   Social Drivers of Health   Financial Resource Strain: Low Risk  (04/11/2023)   Received from [facility]   Overall Financial Resource Strain (CARDIA)    Difficulty of Paying Living Expenses: Not hard at all  Food Insecurity: No Food Insecurity (04/11/2023)   Received from [facility]   Hunger Vital Sign    Within the past 12 months, you worried that your food would run out before you got the money to buy more.: Never true    Within the past 12 months, the food you bought just didn't last and you didn't have money to get more.: Never  true  Transportation Needs: No Transportation Needs (04/11/2023)   Received from [facility] - Transportation    In the past 12 months, has lack of transportation kept you from medical appointments or from getting medications?: No    Lack of Transportation (Non-Medical): No  Physical Activity: Not on file  Stress: Not on file  Social Connections: Not on file  Intimate Partner Violence: Not on file    Past Surgical History:  Procedure Laterality Date   COLONOSCOPY WITH PROPOFOL  N/A 10/07/2014   Procedure: COLONOSCOPY WITH PROPOFOL ;  Surgeon: Lamar ONEIDA Holmes, MD;  Location: [facility] ENDOSCOPY;  Service: Endoscopy;  Laterality: N/A;   HERNIA REPAIR      History reviewed. No pertinent family history.  No Known Allergies      No data to display            CMP  No results found for: NA, K, CL, CO2, GLUCOSE, BUN, CREATININE, CALCIUM, PROT, ALBUMIN, AST, ALT, ALKPHOS, BILITOT, GFR, EGFR, GFRNONAA   No results found.     Assessment & Plan:   1. 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 Patient will continue  wearing graduated compression stockings class 1 on a daily basis, beginning first thing in the morning and removing them in the evening.  The patient is CEAP [id number].  The patient has been wearing compression for more than 12 weeks with no or little benefit.  The patient has been exercising daily for more than 12 weeks. The patient has been elevating and taking OTC pain medications for more than 12 weeks.  None of these have have eliminated the pain related to the varicose veins and venous reflux or the discomfort regarding venous congestion.    In addition, behavioral modification including elevation during the day was again discussed and  this will continue.  The patient has utilized over the counter pain medications and has been exercising.  However, at this time conservative therapy has not alleviated the patient's symptoms of leg pain and swelling  Recommend: laser ablation of the right  great saphenous veins to eliminate the symptoms of pain and swelling of the lower extremities caused by the severe superficial venous reflux disease.    Current Outpatient Medications on File Prior to Visit  Medication Sig Dispense Refill   sildenafil (REVATIO) 20 MG tablet Take 20 mg  by mouth 3 (three) times daily.     torsemide (DEMADEX) 5 MG tablet Take 5 mg by mouth.     valACYclovir (VALTREX) 500 MG tablet Take 500 mg by mouth 2 (two) times daily.     aspirin 81 MG tablet Take 81 mg by mouth daily. (Patient not taking: Reported on 12/07/2023)     hydrOXYzine (ATARAX) 25 MG tablet Take by mouth.     Current Facility-Administered Medications on File Prior to Visit  Medication Dose Route Frequency Provider Last Rate Last Admin   ePHEDrine  injection    Anesthesia Intra-op Cook-Martin, Cindy   5 mg at 10/07/14 0815   fentaNYL  (SUBLIMAZE ) injection    Anesthesia Intra-op Cook-Martin, Cindy   50 mcg at 10/07/14 0815   midazolam  (VERSED ) injection    Anesthesia Intra-op Cook-Martin, Cindy   1 mg at 10/07/14 0815   propofol  (DIPRIVAN ) infusion 10 mg/ml EMUL    Continuous PRN Cook-Martin, Cindy   Stopped at 10/07/14 0830    There are no Patient Instructions on file for this visit. No follow-ups on file.   Ashleyanne Hemmingway E Lowell Makara, NP

## 2023-12-14 ENCOUNTER — Other Ambulatory Visit (INDEPENDENT_AMBULATORY_CARE_PROVIDER_SITE_OTHER): Payer: Self-pay

## 2023-12-14 ENCOUNTER — Encounter (INDEPENDENT_AMBULATORY_CARE_PROVIDER_SITE_OTHER): Payer: Self-pay | Admitting: Nurse Practitioner

## 2023-12-14 ENCOUNTER — Ambulatory Visit (INDEPENDENT_AMBULATORY_CARE_PROVIDER_SITE_OTHER): Admitting: Nurse Practitioner

## 2023-12-14 ENCOUNTER — Telehealth (INDEPENDENT_AMBULATORY_CARE_PROVIDER_SITE_OTHER): Payer: Self-pay | Admitting: Vascular Surgery

## 2023-12-14 ENCOUNTER — Other Ambulatory Visit (INDEPENDENT_AMBULATORY_CARE_PROVIDER_SITE_OTHER): Payer: Self-pay | Admitting: Nurse Practitioner

## 2023-12-14 VITALS — BP 133/77 | HR 73 | Resp 16 | Ht 77.0 in | Wt 204.8 lb

## 2023-12-14 DIAGNOSIS — I83892 Varicose veins of left lower extremities with other complications: Secondary | ICD-10-CM | POA: Diagnosis not present

## 2023-12-14 DIAGNOSIS — I83891 Varicose veins of right lower extremities with other complications: Secondary | ICD-10-CM

## 2023-12-14 NOTE — Telephone Encounter (Signed)
 done

## 2023-12-14 NOTE — Telephone Encounter (Signed)
 Pt is scheduled for a right leg GSV laser ablation with Dr. Marea on 10.3.25. Pt will need the standard RX protocol called in to [facility] in [place]. Thank you.

## 2023-12-14 NOTE — Progress Notes (Addendum)
 History of Present Illness  There is no documented history at this time  Assessments & Plan   There are no diagnoses linked to this encounter.    Additional instructions  Subjective:  Patient presents with venous ulcer of the right lower extremity.    Procedure:  3 layer unna wrap was placed to the right lower extremity.   Plan:   Follow up in one week.    Patient presented for unna wrap, lower right inner ankle had an odor, per pt he had completed antibiotic therapy 2 weeks prior under Dr Cleotilde. Per NP Orvin Daring culture of wound taken and sent out on today, wound cleansed with normal saline, xeroform applied and rewrapped with 3 layer unna boot. Pt to return in 1 wk.  Dois Seip CMA

## 2023-12-15 ENCOUNTER — Other Ambulatory Visit (INDEPENDENT_AMBULATORY_CARE_PROVIDER_SITE_OTHER): Payer: Self-pay | Admitting: Nurse Practitioner

## 2023-12-15 MED ORDER — ALPRAZOLAM 0.5 MG PO TABS: ORAL_TABLET | ORAL | 0 refills | Status: AC

## 2023-12-15 MED ORDER — SULFAMETHOXAZOLE-TRIMETHOPRIM 800-160 MG PO TABS: 1.0000 | ORAL_TABLET | Freq: Two times a day (BID) | ORAL | 0 refills | Status: AC

## 2023-12-20 LAB — AEROBIC CULTURE

## 2023-12-21 ENCOUNTER — Ambulatory Visit (INDEPENDENT_AMBULATORY_CARE_PROVIDER_SITE_OTHER): Admitting: Nurse Practitioner

## 2023-12-21 VITALS — BP 148/86 | HR 69

## 2023-12-21 DIAGNOSIS — I83891 Varicose veins of right lower extremities with other complications: Secondary | ICD-10-CM

## 2023-12-21 MED ORDER — AMOXICILLIN 875 MG PO TABS: 875.0000 mg | ORAL_TABLET | Freq: Two times a day (BID) | ORAL | 0 refills | Status: AC

## 2023-12-21 NOTE — Progress Notes (Signed)
   Subjective:  Patient presents with venous ulcer of the Right lower extremity.    Procedure:  3 layer unna wrap was placed Right lower extremity.   Plan:   Follow up in one week.   Pt presents for zinc unna wrap for right leg. Ulcer still present and weeping with some odor. Per NP pt will be sent in an alternative antibiotic today to [facility]. Site cleansed, covered with xeroform dressing and rewrapped with 3 layer Unna. Pt aware and verbalized understanding to check pharmacy for his medication on this evening and begin that antibiotic in place of prior prescribed antibiotic. Pt to follow up again in 1 week.   Dois Seip CMA

## 2023-12-25 ENCOUNTER — Encounter (INDEPENDENT_AMBULATORY_CARE_PROVIDER_SITE_OTHER): Payer: Self-pay | Admitting: Nurse Practitioner

## 2023-12-28 ENCOUNTER — Ambulatory Visit (INDEPENDENT_AMBULATORY_CARE_PROVIDER_SITE_OTHER): Admitting: Nurse Practitioner

## 2023-12-28 ENCOUNTER — Encounter (INDEPENDENT_AMBULATORY_CARE_PROVIDER_SITE_OTHER): Payer: Self-pay

## 2023-12-28 VITALS — BP 117/70 | HR 79 | Resp 16 | Ht 77.0 in | Wt 202.8 lb

## 2023-12-28 DIAGNOSIS — L84 Corns and callosities: Secondary | ICD-10-CM | POA: Diagnosis not present

## 2023-12-28 NOTE — Progress Notes (Signed)
 History of Present Illness  There is no documented history at this time  Assessments & Plan   There are no diagnoses linked to this encounter.    Additional instructions  Subjective:  Patient presents with venous ulcer of the Right lower extremity.    Procedure:  3 layer unna wrap was placed Right lower extremity.  Pt presented for unna of right leg, ulcer cleansed with normal saline covered with Aquacel, 3 layer zinc unna wrap applied. Pt reports he will finish his antibiotic on this Friday.   Plan:   Follow up in one week.    Jesse Cameron CMA

## 2023-12-30 ENCOUNTER — Ambulatory Visit (INDEPENDENT_AMBULATORY_CARE_PROVIDER_SITE_OTHER): Admitting: Vascular Surgery

## 2023-12-30 VITALS — BP 145/90 | HR 77 | Resp 16 | Ht 77.0 in | Wt 204.8 lb

## 2023-12-30 DIAGNOSIS — L97909 Non-pressure chronic ulcer of unspecified part of unspecified lower leg with unspecified severity: Secondary | ICD-10-CM | POA: Insufficient documentation

## 2023-12-30 DIAGNOSIS — L97211 Non-pressure chronic ulcer of right calf limited to breakdown of skin: Secondary | ICD-10-CM | POA: Diagnosis not present

## 2023-12-30 DIAGNOSIS — I83012 Varicose veins of right lower extremity with ulcer of calf: Secondary | ICD-10-CM | POA: Diagnosis not present

## 2023-12-30 NOTE — Progress Notes (Signed)
  Jesse Cameron is a 68 y.o. male who presents with symptomatic venous reflux  No past medical history on file.  Past Surgical History:  Procedure Laterality Date   COLONOSCOPY WITH PROPOFOL  N/A 10/07/2014   Procedure: COLONOSCOPY WITH PROPOFOL ;  Surgeon: Lamar ONEIDA Holmes, MD;  Location: [facility] ENDOSCOPY;  Service: Endoscopy;  Laterality: N/A;   HERNIA REPAIR       Current Outpatient Medications:    ALPRAZolam  (XANAX ) 0.5 MG tablet, Take 1 tablet 1 hour before procedure then take 2nd tablet once arrived at the office, Disp: 2 tablet, Rfl: 0   amoxicillin  (AMOXIL ) 875 MG tablet, Take 1 tablet (875 mg total) by mouth 2 (two) times daily., Disp: 20 tablet, Rfl: 0   aspirin 81 MG tablet, Take 81 mg by mouth daily., Disp: , Rfl:    hydrOXYzine (ATARAX) 25 MG tablet, Take by mouth., Disp: , Rfl:    sildenafil (REVATIO) 20 MG tablet, Take 20 mg by mouth 3 (three) times daily., Disp: , Rfl:    sulfamethoxazole -trimethoprim  (BACTRIM  DS) 800-160 MG tablet, Take 1 tablet by mouth 2 (two) times daily., Disp: 20 tablet, Rfl: 0   torsemide (DEMADEX) 5 MG tablet, Take 5 mg by mouth., Disp: , Rfl:    valACYclovir (VALTREX) 500 MG tablet, Take 500 mg by mouth 2 (two) times daily., Disp: , Rfl:  No current facility-administered medications for this visit.  Facility-Administered Medications Ordered in Other Visits:    ePHEDrine  injection, , , Anesthesia Intra-op, Cook-Martin, Cindy, 5 mg at 10/07/14 0815   fentaNYL  (SUBLIMAZE ) injection, , , Anesthesia Intra-op, Cook-Martin, Cindy, 50 mcg at 10/07/14 0815   midazolam  (VERSED ) injection, , , Anesthesia Intra-op, Cook-Martin, Cindy, 1 mg at 10/07/14 0815   propofol  (DIPRIVAN ) infusion 10 mg/ml EMUL, , , Continuous PRN, Cook-Martin, Cindy, Stopped at 10/07/14 0830  No Known Allergies   Varicose veins of lower extremities with ulcer (HCC)   PLAN: The patient's right lower extremity was sterilely prepped and draped. The ultrasound machine was used to  visualize the saphenous vein throughout its course. A segment in the upper calf was selected for access. The saphenous vein was accessed without difficulty using ultrasound guidance with a micropuncture needle. A 0.018 wire was then placed beyond the saphenofemoral junction and the needle was removed. The 65 cm sheath was then placed over the wire and the wire and dilator were removed. The laser fiber was then placed through the sheath and its tip was placed approximately 4-5 centimeters below the saphenofemoral junction. Tumescent anesthesia was then created with a dilute lidocaine solution. Laser energy was then delivered with constant withdrawal of the sheath and laser fiber. Approximately 1737 joules of energy were delivered over a length of 39 centimeters using a 1470 Hz VenaCure machine at 7 W. Sterile dressings were placed. The patient tolerated the procedure well without obvious complications.   Follow-up in 1 week with post-laser duplex.

## 2024-01-04 ENCOUNTER — Encounter (INDEPENDENT_AMBULATORY_CARE_PROVIDER_SITE_OTHER)

## 2024-01-05 ENCOUNTER — Other Ambulatory Visit (INDEPENDENT_AMBULATORY_CARE_PROVIDER_SITE_OTHER): Payer: Self-pay | Admitting: Vascular Surgery

## 2024-01-05 DIAGNOSIS — I83012 Varicose veins of right lower extremity with ulcer of calf: Secondary | ICD-10-CM

## 2024-01-06 ENCOUNTER — Ambulatory Visit (INDEPENDENT_AMBULATORY_CARE_PROVIDER_SITE_OTHER): Admitting: Nurse Practitioner

## 2024-01-06 ENCOUNTER — Other Ambulatory Visit (INDEPENDENT_AMBULATORY_CARE_PROVIDER_SITE_OTHER)

## 2024-01-06 DIAGNOSIS — L97211 Non-pressure chronic ulcer of right calf limited to breakdown of skin: Secondary | ICD-10-CM | POA: Diagnosis not present

## 2024-01-06 DIAGNOSIS — I83012 Varicose veins of right lower extremity with ulcer of calf: Secondary | ICD-10-CM | POA: Diagnosis not present

## 2024-01-06 NOTE — Progress Notes (Signed)
 History of Present Illness  There is no documented history at this time  Assessments & Plan   There are no diagnoses linked to this encounter.    Additional instructions  Subjective:  Patient presents with venous ulcer of the Right lower extremity.    Procedure:  3 layer unna wrap was placed Right lower extremity.  Zinc unna wrap applied ulcer has greatly improved covered with Aquacel and 3 layer unna applied   Plan:   Follow up in one week.

## 2024-01-10 ENCOUNTER — Ambulatory Visit: Admitting: Podiatry

## 2024-01-10 ENCOUNTER — Encounter: Payer: Self-pay | Admitting: Podiatry

## 2024-01-10 ENCOUNTER — Encounter (INDEPENDENT_AMBULATORY_CARE_PROVIDER_SITE_OTHER): Payer: Self-pay

## 2024-01-10 VITALS — Ht 77.0 in | Wt 204.0 lb

## 2024-01-10 DIAGNOSIS — D2372 Other benign neoplasm of skin of left lower limb, including hip: Secondary | ICD-10-CM

## 2024-01-13 ENCOUNTER — Ambulatory Visit (INDEPENDENT_AMBULATORY_CARE_PROVIDER_SITE_OTHER): Admitting: Vascular Surgery

## 2024-01-13 VITALS — BP 139/82 | HR 73 | Resp 18 | Ht 77.0 in | Wt 204.6 lb

## 2024-01-13 DIAGNOSIS — I83013 Varicose veins of right lower extremity with ulcer of ankle: Secondary | ICD-10-CM

## 2024-01-13 DIAGNOSIS — L97311 Non-pressure chronic ulcer of right ankle limited to breakdown of skin: Secondary | ICD-10-CM | POA: Insufficient documentation

## 2024-01-13 NOTE — Progress Notes (Signed)
 MRN : [id number]  Jesse Cameron is a 68 y.o. (1956-01-04) male who presents with chief complaint of  Chief Complaint  Patient presents with   Follow-up    unna  .  History of Present Illness: Patient returns today in follow up of venous insufficiency and stasis ulceration of the right ankle.  He underwent laser ablation about 2 weeks ago with a successful laser ablation of the right great saphenous vein.  He is done well from this.  He has some mild soreness and bruising on the inner thigh area but this is largely resolved.  Duplex showed this to be successful postoperatively.  He has a small clean ulceration on the right medial ankle just below the malleolus.  This seems to be healing well but we are doing weekly Unna boots.  This is no longer significantly painful.  Not erythematous.  No drainage.  Current Outpatient Medications  Medication Sig Dispense Refill   ALPRAZolam  (XANAX ) 0.5 MG tablet Take 1 tablet 1 hour before procedure then take 2nd tablet once arrived at the office 2 tablet 0   amoxicillin  (AMOXIL ) 875 MG tablet Take 1 tablet (875 mg total) by mouth 2 (two) times daily. 20 tablet 0   aspirin 81 MG tablet Take 81 mg by mouth daily.     hydrOXYzine (ATARAX) 25 MG tablet Take by mouth.     sildenafil (REVATIO) 20 MG tablet Take 20 mg by mouth 3 (three) times daily.     sulfamethoxazole -trimethoprim  (BACTRIM  DS) 800-160 MG tablet Take 1 tablet by mouth 2 (two) times daily. 20 tablet 0   torsemide (DEMADEX) 5 MG tablet Take 5 mg by mouth.     valACYclovir (VALTREX) 500 MG tablet Take 500 mg by mouth 2 (two) times daily.     No current facility-administered medications for this visit.   Facility-Administered Medications Ordered in Other Visits  Medication Dose Route Frequency Provider Last Rate Last Admin   ePHEDrine  injection    Anesthesia Intra-op Cook-Martin, Cindy   5 mg at 10/07/14 0815   fentaNYL  (SUBLIMAZE ) injection    Anesthesia Intra-op Cook-Martin, Cindy   50  mcg at 10/07/14 0815   midazolam  (VERSED ) injection    Anesthesia Intra-op Cook-Martin, Cindy   1 mg at 10/07/14 0815   propofol  (DIPRIVAN ) infusion 10 mg/ml EMUL    Continuous PRN Cook-Martin, Cindy   Stopped at 10/07/14 0830    No past medical history on file.  Past Surgical History:  Procedure Laterality Date   COLONOSCOPY WITH PROPOFOL  N/A 10/07/2014   Procedure: COLONOSCOPY WITH PROPOFOL ;  Surgeon: Jesse ONEIDA Holmes, MD;  Location: [facility] ENDOSCOPY;  Service: Endoscopy;  Laterality: N/A;   HERNIA REPAIR       Social History   Tobacco Use   Smoking status: Former  Substance Use Topics   Alcohol use: Yes    Comment: weekends       No family history on file.   No Known Allergies   REVIEW OF SYSTEMS (Negative unless checked)  Constitutional: [] Weight loss  [] Fever  [] Chills Cardiac: [] Chest pain   [] Chest pressure   [] Palpitations   [] Shortness of breath when laying flat   [] Shortness of breath at rest   [] Shortness of breath with exertion. Vascular:  [] Pain in legs with walking   [] Pain in legs at rest   [] Pain in legs when laying flat   [] Claudication   [] Pain in feet when walking  [] Pain in feet at rest  [] Pain in feet when  laying flat   [] History of DVT   [] Phlebitis   [x] Swelling in legs   [] Varicose veins   [x] Non-healing ulcers Pulmonary:   [] Uses home oxygen   [] Productive cough   [] Hemoptysis   [] Wheeze  [] COPD   [] Asthma Neurologic:  [] Dizziness  [] Blackouts   [] Seizures   [] History of stroke   [] History of TIA  [] Aphasia   [] Temporary blindness   [] Dysphagia   [] Weakness or numbness in arms   [] Weakness or numbness in legs Musculoskeletal:  [] Arthritis   [] Joint swelling   [] Joint pain   [] Low back pain Hematologic:  [] Easy bruising  [] Easy bleeding   [] Hypercoagulable state   [] Anemic   Gastrointestinal:  [] Blood in stool   [] Vomiting blood  [] Gastroesophageal reflux/heartburn   [] Abdominal pain Genitourinary:  [] Chronic kidney disease   [] Difficult urination   [] Frequent urination  [] Burning with urination   [] Hematuria Skin:  [] Rashes   [x] Ulcers   [x] Wounds Psychological:  [] History of anxiety   []  History of major depression.  Physical Examination  BP 139/82   Pulse 73   Resp 18   Ht 6' 5 (1.956 m)   Wt 204 lb 9.6 oz (92.8 kg)   BMI 24.26 kg/m  Gen:  WD/WN, NAD.  Tall and thin. Head: [place]/AT, No temporalis wasting. Ear/Nose/Throat: Hearing grossly intact, nares w/o erythema or drainage Eyes: Conjunctiva clear. Sclera non-icteric Neck: Supple.  Trachea midline Pulmonary:  Good air movement, no use of accessory muscles.  Cardiac: RRR, no JVD Vascular:  Vessel Right Left  Radial Palpable Palpable       Musculoskeletal: M/S 5/5 throughout.  No deformity or atrophy.  Small, dime size ulceration on the medial right ankle just below the malleolus with no erythema or drainage today.  This is relatively superficial.  Trace right lower extremity edema. Neurologic: Sensation grossly intact in extremities.  Symmetrical.  Speech is fluent.  Psychiatric: Judgment intact, Mood & affect appropriate for pt's clinical situation.       Labs Recent Results (from the past 2160 hours)  Aerobic culture     Status: Abnormal   Collection Time: 12/14/23  9:45 AM  Result Value Ref Range   Aerobic Bacterial Culture Final report (A)    Organism ID, Bacteria Comment (A)     Comment: Beta hemolytic Streptococcus, group B Heavy growth Penicillin and ampicillin are drugs of choice for treatment of beta-hemolytic streptococcal infections. Susceptibility testing of penicillins and other beta-lactam agents approved by the FDA for treatment of beta-hemolytic streptococcal infections need not be performed routinely because nonsusceptible isolates are extremely rare in any beta-hemolytic streptococcus and have not been reported for Streptococcus pyogenes (group A). (CLSI)    Organism ID, Bacteria Routine flora     Comment: Scant growth    Radiology VAS  US  LOWER EXTREMITY VENOUS POST ABLATION Result Date: 01/13/2024  Lower Venous DVT Study Patient Name:  Jesse Cameron  Date of Exam:   01/06/2024 Medical Rec #: [id number]          Accession #:    [id number] Date of Birth: 1955-11-15           Patient Gender: M Patient Age:   50 years Exam Location:  [facility] Procedure:      VAS US  LOWER EXTREMITY VENOUS (DVT) Referring Phys: SELINDA GU --------------------------------------------------------------------------------  Indications: Post-op.  Performing Technologist: Donnice Charnley RVT  Examination Guidelines: A complete evaluation includes B-mode imaging, spectral Doppler, color Doppler, and power Doppler as needed of all  accessible portions of each vessel. Bilateral testing is considered an integral part of a complete examination. Limited examinations for reoccurring indications may be performed as noted. The reflux portion of the exam is performed with the patient in reverse Trendelenburg.  Post Ablation: Follow up evaluation status post ablation of right Great Saphenous Vein on 12/30/2023.  +---------+---------------+---------+-----------+----------+--------------+ RIGHT    CompressibilityPhasicitySpontaneityPropertiesThrombus Aging +---------+---------------+---------+-----------+----------+--------------+ CFV      Full           Yes      Yes                                 +---------+---------------+---------+-----------+----------+--------------+ SFJ      Full                    Yes                                 +---------+---------------+---------+-----------+----------+--------------+ FV Prox  Full                    Yes                                 +---------+---------------+---------+-----------+----------+--------------+ FV Mid   Full                    Yes                                 +---------+---------------+---------+-----------+----------+--------------+ FV DistalFull                     Yes                                 +---------+---------------+---------+-----------+----------+--------------+ GSV      None           No       No         dilated                  +---------+---------------+---------+-----------+----------+--------------+   +----+---------------+---------+-----------+----------+--------------+ LEFTCompressibilityPhasicitySpontaneityPropertiesThrombus Aging +----+---------------+---------+-----------+----------+--------------+ CFV Full           Yes      Yes                                 +----+---------------+---------+-----------+----------+--------------+     Summary: RIGHT: - Successful ablation of right great saphenous vein.  LEFT: - No evidence of common femoral vein obstruction.   Post Ablation Summary: - Successful ablation of the right Great Saphenous Vein from the Proximal calf to within a cm of the SFJ.  *See table(s) above for measurements and observations. Electronically signed by Selinda Gu MD on 01/13/2024 at 10:41:10 AM.    Final     Assessment/Plan  Varicose veins of lower extremities with ulcer (HCC) Doing well about 2 weeks status post laser ablation of the right great saphenous vein for venous stasis ulceration.  Ulcer is healing.  Continue weekly Unna boots and 1 was placed today.  He has a follow-up appointment in 2 or 3 weeks and we will keep that and hopefully  we are about to come out of into boots at that time.  Lower limb ulcer, ankle, right, limited to breakdown of skin (HCC) 3 layer Unna boot was placed today and will be changed weekly.  Venous intervention as above to reduce recurrence risk once healed.    Selinda Gu, MD  01/13/2024 12:52 PM    This note was created with Dragon medical transcription system.  Any errors from dictation are purely unintentional

## 2024-01-13 NOTE — Assessment & Plan Note (Signed)
 3 layer Unna boot was placed today and will be changed weekly.  Venous intervention as above to reduce recurrence risk once healed.

## 2024-01-13 NOTE — Assessment & Plan Note (Signed)
 Doing well about 2 weeks status post laser ablation of the right great saphenous vein for venous stasis ulceration.  Ulcer is healing.  Continue weekly Unna boots and 1 was placed today.  He has a follow-up appointment in 2 or 3 weeks and we will keep that and hopefully we are about to come out of into boots at that time.

## 2024-01-20 ENCOUNTER — Encounter (INDEPENDENT_AMBULATORY_CARE_PROVIDER_SITE_OTHER): Payer: Self-pay

## 2024-01-20 ENCOUNTER — Ambulatory Visit (INDEPENDENT_AMBULATORY_CARE_PROVIDER_SITE_OTHER): Admitting: Nurse Practitioner

## 2024-01-20 VITALS — BP 128/78 | HR 78 | Resp 18 | Ht 76.0 in | Wt 205.4 lb

## 2024-01-20 DIAGNOSIS — L97311 Non-pressure chronic ulcer of right ankle limited to breakdown of skin: Secondary | ICD-10-CM

## 2024-01-20 DIAGNOSIS — I83013 Varicose veins of right lower extremity with ulcer of ankle: Secondary | ICD-10-CM

## 2024-01-20 NOTE — Progress Notes (Signed)
 History of Present Illness  There is no documented history at this time  Assessments & Plan   There are no diagnoses linked to this encounter.    Additional instructions  Subjective:  Patient presents with venous ulcer of the Right lower extremity.    Procedure:  3 layer unna wrap was placed Right lower extremity.   Plan:   Follow up in one week.

## 2024-01-22 ENCOUNTER — Encounter (INDEPENDENT_AMBULATORY_CARE_PROVIDER_SITE_OTHER): Payer: Self-pay | Admitting: Nurse Practitioner

## 2024-01-23 NOTE — Progress Notes (Signed)
   Chief Complaint  Patient presents with   Callouses    Left foot callous.    Subjective: 68 y.o. male presenting to the office today for evaluation of a symptomatic skin lesion to the plantar aspect of the left foot.   No past medical history on file.  Past Surgical History:  Procedure Laterality Date   COLONOSCOPY WITH PROPOFOL  N/A 10/07/2014   Procedure: COLONOSCOPY WITH PROPOFOL ;  Surgeon: Lamar ONEIDA Holmes, MD;  Location: [facility] ENDOSCOPY;  Service: Endoscopy;  Laterality: N/A;   HERNIA REPAIR      No Known Allergies   Objective:  Physical Exam General: Alert and oriented x3 in no acute distress  Dermatology: Hyperkeratotic lesion(s) present on the plantar aspect left foot. Pain on palpation with a central nucleated core noted. Skin is warm, dry and supple bilateral lower extremities. Negative for open lesions or macerations.  Vascular: Palpable pedal pulses bilaterally. No edema or erythema noted. Capillary refill within normal limits.  Neurological: Grossly intact via light touch  Musculoskeletal Exam: Pain on palpation at the keratotic lesion(s) noted. Range of motion within normal limits bilateral. Muscle strength 5/5 in all groups bilateral.  Assessment: 1.  Eccrine poroma plantar aspect left foot   Plan of Care:  -Patient evaluated -Excisional debridement of keratoic lesion(s) using a chisel blade was performed without incident.  -Salicylic acid applied with a bandaid -Return to the clinic PRN.   Thresa EMERSON Sar, DPM [facility]  Dr. Thresa EMERSON Sar, DPM    [address] [place], [place] [address]                Office [phone]  Fax [phone]

## 2024-01-27 ENCOUNTER — Encounter (INDEPENDENT_AMBULATORY_CARE_PROVIDER_SITE_OTHER): Payer: Self-pay | Admitting: Vascular Surgery

## 2024-01-27 ENCOUNTER — Ambulatory Visit (INDEPENDENT_AMBULATORY_CARE_PROVIDER_SITE_OTHER): Admitting: Vascular Surgery

## 2024-01-27 VITALS — BP 141/91 | HR 85 | Resp 18 | Ht 76.0 in | Wt 206.2 lb

## 2024-01-27 DIAGNOSIS — L97311 Non-pressure chronic ulcer of right ankle limited to breakdown of skin: Secondary | ICD-10-CM | POA: Diagnosis not present

## 2024-01-27 DIAGNOSIS — I83013 Varicose veins of right lower extremity with ulcer of ankle: Secondary | ICD-10-CM | POA: Diagnosis not present

## 2024-01-27 NOTE — Assessment & Plan Note (Signed)
 Improved, almost completely healed. Will stop UNNA boots.

## 2024-01-27 NOTE — Progress Notes (Signed)
 MRN : [id number]  Jesse Cameron is a 68 y.o. (Aug 19, 1955) male who presents with chief complaint of  Chief Complaint  Patient presents with   Follow-up    4 week follow up post laser R leg GSV (unna cut off on arrival)  .  History of Present Illness: Patient returns today in follow up of his venous insufficiency and ulceration.  The small right ankle ulceration has almost completely healed at this point.  It is really only about the size of a pinhead.  His swelling has gotten much better after laser ablation.  He is doing quite well.  No fevers or chills or signs of infection.  Current Outpatient Medications  Medication Sig Dispense Refill   ALPRAZolam  (XANAX ) 0.5 MG tablet Take 1 tablet 1 hour before procedure then take 2nd tablet once arrived at the office 2 tablet 0   amoxicillin  (AMOXIL ) 875 MG tablet Take 1 tablet (875 mg total) by mouth 2 (two) times daily. 20 tablet 0   aspirin 81 MG tablet Take 81 mg by mouth daily.     hydrOXYzine (ATARAX) 25 MG tablet Take by mouth.     sildenafil (REVATIO) 20 MG tablet Take 20 mg by mouth 3 (three) times daily.     sulfamethoxazole -trimethoprim  (BACTRIM  DS) 800-160 MG tablet Take 1 tablet by mouth 2 (two) times daily. 20 tablet 0   torsemide (DEMADEX) 5 MG tablet Take 5 mg by mouth.     valACYclovir (VALTREX) 500 MG tablet Take 500 mg by mouth 2 (two) times daily.     No current facility-administered medications for this visit.   Facility-Administered Medications Ordered in Other Visits  Medication Dose Route Frequency Provider Last Rate Last Admin   ePHEDrine  injection    Anesthesia Intra-op Cook-Martin, Cindy   5 mg at 10/07/14 0815   fentaNYL  (SUBLIMAZE ) injection    Anesthesia Intra-op Cook-Martin, Cindy   50 mcg at 10/07/14 0815   midazolam  (VERSED ) injection    Anesthesia Intra-op Cook-Martin, Cindy   1 mg at 10/07/14 0815   propofol  (DIPRIVAN ) infusion 10 mg/ml EMUL    Continuous PRN Cook-Martin, Cindy   Stopped at 10/07/14 0830     History reviewed. No pertinent past medical history.  Past Surgical History:  Procedure Laterality Date   COLONOSCOPY WITH PROPOFOL  N/A 10/07/2014   Procedure: COLONOSCOPY WITH PROPOFOL ;  Surgeon: Lamar ONEIDA Holmes, MD;  Location[facility] ENDOSCOPY;  Service: Endoscopy;  Laterality: N/A;   HERNIA REPAIR       Social History   Tobacco Use   Smoking status: Former  Substance Use Topics   Alcohol use: Yes    Comment: weekends       History reviewed. No pertinent family history.   No Known Allergies  REVIEW OF SYSTEMS (Negative unless checked)   Constitutional: [] Weight loss  [] Fever  [] Chills Cardiac: [] Chest pain   [] Chest pressure   [] Palpitations   [] Shortness of breath when laying flat   [] Shortness of breath at rest   [] Shortness of breath with exertion. Vascular:  [] Pain in legs with walking   [] Pain in legs at rest   [] Pain in legs when laying flat   [] Claudication   [] Pain in feet when walking  [] Pain in feet at rest  [] Pain in feet when laying flat   [] History of DVT   [] Phlebitis   [x] Swelling in legs   [] Varicose veins   [x] Non-healing ulcers Pulmonary:   [] Uses home oxygen   [] Productive cough   [] Hemoptysis   []   Wheeze  [] COPD   [] Asthma Neurologic:  [] Dizziness  [] Blackouts   [] Seizures   [] History of stroke   [] History of TIA  [] Aphasia   [] Temporary blindness   [] Dysphagia   [] Weakness or numbness in arms   [] Weakness or numbness in legs Musculoskeletal:  [] Arthritis   [] Joint swelling   [] Joint pain   [] Low back pain Hematologic:  [] Easy bruising  [] Easy bleeding   [] Hypercoagulable state   [] Anemic   Gastrointestinal:  [] Blood in stool   [] Vomiting blood  [] Gastroesophageal reflux/heartburn   [] Abdominal pain Genitourinary:  [] Chronic kidney disease   [] Difficult urination  [] Frequent urination  [] Burning with urination   [] Hematuria Skin:  [] Rashes   [x] Ulcers   [x] Wounds Psychological:  [] History of anxiety   []  History of depression  Physical  Examination  BP (!) 141/91 (BP Location: Left Arm, Patient Position: Sitting, Cuff Size: Normal)   Pulse 85   Resp 18   Ht 6' 4 (1.93 m)   Wt 206 lb 3.2 oz (93.5 kg)   BMI 25.10 kg/m  Gen:  WD/WN, NAD Head: [place]/AT, No temporalis wasting. Ear/Nose/Throat: Hearing grossly intact, nares w/o erythema or drainage Eyes: Conjunctiva clear. Sclera non-icteric Neck: Supple.  Trachea midline Pulmonary:  Good air movement, no use of accessory muscles.  Cardiac: RRR, no JVD Vascular:  Vessel Right Left  Radial Palpable Palpable               Musculoskeletal: M/S 5/5 throughout.  No deformity or atrophy.  Stasis dermatitis changes are prominent on that right leg.  Trace right lower extremity edema. Neurologic: Sensation grossly intact in extremities.  Symmetrical.  Speech is fluent.  Psychiatric: Judgment intact, Mood & affect appropriate for pt's clinical situation. Dermatologic: Tiny area that has not completely healed just below the right medial malleolus.  This is not much larger than a pinhead at this point.      Labs Recent Results (from the past 2160 hours)  Aerobic culture     Status: Abnormal   Collection Time: 12/14/23  9:45 AM  Result Value Ref Range   Aerobic Bacterial Culture Final report (A)    Organism ID, Bacteria Comment (A)     Comment: Beta hemolytic Streptococcus, group B Heavy growth Penicillin and ampicillin are drugs of choice for treatment of beta-hemolytic streptococcal infections. Susceptibility testing of penicillins and other beta-lactam agents approved by the FDA for treatment of beta-hemolytic streptococcal infections need not be performed routinely because nonsusceptible isolates are extremely rare in any beta-hemolytic streptococcus and have not been reported for Streptococcus pyogenes (group A). (CLSI)    Organism ID, Bacteria Routine flora     Comment: Scant growth    Radiology VAS US  LOWER EXTREMITY VENOUS POST ABLATION Result Date:  01/13/2024  Lower Venous DVT Study Patient Name:  DONTE LENZO  Date of Exam:   01/06/2024 Medical Rec #: [id number]          Accession #:    [id number] Date of Birth: [birth year]           Patient Gender: M Patient Age:   34 years Exam Location:  [facility] Procedure:      VAS US  LOWER EXTREMITY VENOUS (DVT) Referring Phys: SELINDA GU --------------------------------------------------------------------------------  Indications: Post-op.  Performing Technologist: Donnice Charnley RVT  Examination Guidelines: A complete evaluation includes B-mode imaging, spectral Doppler, color Doppler, and power Doppler as needed of all accessible portions of each vessel. Bilateral testing is considered an integral part of a  complete examination. Limited examinations for reoccurring indications may be performed as noted. The reflux portion of the exam is performed with the patient in reverse Trendelenburg.  Post Ablation: Follow up evaluation status post ablation of right Great Saphenous Vein on 12/30/2023.  +---------+---------------+---------+-----------+----------+--------------+ RIGHT    CompressibilityPhasicitySpontaneityPropertiesThrombus Aging +---------+---------------+---------+-----------+----------+--------------+ CFV      Full           Yes      Yes                                 +---------+---------------+---------+-----------+----------+--------------+ SFJ      Full                    Yes                                 +---------+---------------+---------+-----------+----------+--------------+ FV Prox  Full                    Yes                                 +---------+---------------+---------+-----------+----------+--------------+ FV Mid   Full                    Yes                                 +---------+---------------+---------+-----------+----------+--------------+ FV DistalFull                    Yes                                  +---------+---------------+---------+-----------+----------+--------------+ GSV      None           No       No         dilated                  +---------+---------------+---------+-----------+----------+--------------+   +----+---------------+---------+-----------+----------+--------------+ LEFTCompressibilityPhasicitySpontaneityPropertiesThrombus Aging +----+---------------+---------+-----------+----------+--------------+ CFV Full           Yes      Yes                                 +----+---------------+---------+-----------+----------+--------------+     Summary: RIGHT: - Successful ablation of right great saphenous vein.  LEFT: - No evidence of common femoral vein obstruction.   Post Ablation Summary: - Successful ablation of the right Great Saphenous Vein from the Proximal calf to within a cm of the SFJ.  *See table(s) above for measurements and observations. Electronically signed by Selinda Gu MD on 01/13/2024 at 10:41:10 AM.    Final     Assessment/Plan  Varicose veins of lower extremities with ulcer (HCC) Much better after laser ablation. Will come out of UNNA boots. Recheck in a month.  Lower limb ulcer, ankle, right, limited to breakdown of skin (HCC) Improved, almost completely healed. Will stop UNNA boots.    Selinda Gu, MD  01/27/2024 9:53 AM    This note was created with Dragon medical transcription system.  Any errors from dictation are purely unintentional

## 2024-01-27 NOTE — Assessment & Plan Note (Signed)
 Much better after laser ablation. Will come out of UNNA boots. Recheck in a month.

## 2024-01-31 ENCOUNTER — Telehealth (INDEPENDENT_AMBULATORY_CARE_PROVIDER_SITE_OTHER): Payer: Self-pay

## 2024-01-31 NOTE — Telephone Encounter (Signed)
 Patient left message at this time concerned about pain in his ankle and stated it is excreting fluid at this time. Patient is just concerned if he needs to be seen at this time.  Just spoke to patient he stated he wore a nude color compression sock yesterday and noticed red tinted stain on the sock an fluid oozing from his R ankle. Patient stated he still has some oozing this morning,clear in color, but he is more concerned about the pain, because when he was seen in the office last week Friday 01/27/24 he did not have any pain.Elevation does help according to the patient, he was just taken out of unna wraps last week and wants to know does he need to be seen at this time or does he need to go back into unna wraps? Please advise.

## 2024-01-31 NOTE — Telephone Encounter (Signed)
 Called and spoke to patient at this time to let him know the plan would be to get him back into unna wraps, he was happy with that. I told him the front would be calling to schedule.

## 2024-01-31 NOTE — Telephone Encounter (Signed)
 Let's put him back in wraps

## 2024-02-02 ENCOUNTER — Encounter (INDEPENDENT_AMBULATORY_CARE_PROVIDER_SITE_OTHER): Payer: Self-pay

## 2024-02-02 ENCOUNTER — Ambulatory Visit (INDEPENDENT_AMBULATORY_CARE_PROVIDER_SITE_OTHER): Admitting: Nurse Practitioner

## 2024-02-02 VITALS — BP 150/89 | HR 78 | Resp 16

## 2024-02-02 DIAGNOSIS — I83013 Varicose veins of right lower extremity with ulcer of ankle: Secondary | ICD-10-CM | POA: Diagnosis not present

## 2024-02-02 DIAGNOSIS — L97311 Non-pressure chronic ulcer of right ankle limited to breakdown of skin: Secondary | ICD-10-CM

## 2024-02-02 NOTE — Progress Notes (Signed)
 History of Present Illness  There is no documented history at this time  Assessments & Plan   There are no diagnoses linked to this encounter.    Additional instructions  Subjective:  Patient presents with venous ulcer of the Right lower extremity.    Procedure:  3 layer unna wrap was placed Right lower extremity.   Plan:   Follow up in one week.

## 2024-02-03 ENCOUNTER — Encounter (INDEPENDENT_AMBULATORY_CARE_PROVIDER_SITE_OTHER)

## 2024-02-04 ENCOUNTER — Encounter (INDEPENDENT_AMBULATORY_CARE_PROVIDER_SITE_OTHER): Payer: Self-pay | Admitting: Nurse Practitioner

## 2024-02-09 ENCOUNTER — Ambulatory Visit (INDEPENDENT_AMBULATORY_CARE_PROVIDER_SITE_OTHER): Admitting: Nurse Practitioner

## 2024-02-09 VITALS — BP 122/82 | HR 68

## 2024-02-09 DIAGNOSIS — L97311 Non-pressure chronic ulcer of right ankle limited to breakdown of skin: Secondary | ICD-10-CM | POA: Diagnosis not present

## 2024-02-09 DIAGNOSIS — I83013 Varicose veins of right lower extremity with ulcer of ankle: Secondary | ICD-10-CM

## 2024-02-09 NOTE — Progress Notes (Signed)
 History of Present Illness  There is no documented history at this time  Assessments & Plan   There are no diagnoses linked to this encounter.    Additional instructions  Subjective:  Patient presents with venous ulcer of the Right lower extremity.    Procedure:  3 layer unna wrap was placed Right lower extremity.   Plan:   Follow up in one week.

## 2024-02-11 ENCOUNTER — Encounter (INDEPENDENT_AMBULATORY_CARE_PROVIDER_SITE_OTHER): Payer: Self-pay | Admitting: Nurse Practitioner

## 2024-02-16 ENCOUNTER — Ambulatory Visit (INDEPENDENT_AMBULATORY_CARE_PROVIDER_SITE_OTHER): Admitting: Nurse Practitioner

## 2024-02-16 ENCOUNTER — Encounter (INDEPENDENT_AMBULATORY_CARE_PROVIDER_SITE_OTHER): Payer: Self-pay

## 2024-02-16 DIAGNOSIS — L97311 Non-pressure chronic ulcer of right ankle limited to breakdown of skin: Secondary | ICD-10-CM | POA: Diagnosis not present

## 2024-02-16 DIAGNOSIS — I83013 Varicose veins of right lower extremity with ulcer of ankle: Secondary | ICD-10-CM | POA: Diagnosis not present

## 2024-02-16 NOTE — Progress Notes (Signed)
 History of Present Illness  There is no documented history at this time  Assessments & Plan   There are no diagnoses linked to this encounter.    Additional instructions  Subjective:  Patient presents with venous ulcer of the Right lower extremity.    Procedure:  3 layer unna wrap was placed Right lower extremity.   Plan:   Follow up in one week.

## 2024-02-18 ENCOUNTER — Encounter (INDEPENDENT_AMBULATORY_CARE_PROVIDER_SITE_OTHER): Payer: Self-pay | Admitting: Nurse Practitioner

## 2024-02-21 ENCOUNTER — Ambulatory Visit (INDEPENDENT_AMBULATORY_CARE_PROVIDER_SITE_OTHER): Admitting: Vascular Surgery

## 2024-02-21 VITALS — BP 135/81 | HR 81 | Resp 17 | Wt 206.6 lb

## 2024-02-21 DIAGNOSIS — I83013 Varicose veins of right lower extremity with ulcer of ankle: Secondary | ICD-10-CM

## 2024-02-21 DIAGNOSIS — L97311 Non-pressure chronic ulcer of right ankle limited to breakdown of skin: Secondary | ICD-10-CM | POA: Diagnosis not present

## 2024-02-21 NOTE — Progress Notes (Signed)
 MRN : [id number]  Jesse Cameron is a 68 y.o. ([birth year]) male who presents with chief complaint of  Chief Complaint  Patient presents with   Follow-up    1 month follow up   .  History of Present Illness: Patient returns today in follow up of his leg swelling and ulceration.  He is a couple months status post right great saphenous vein laser ablation with good results.  His ulcer has healed.  He is come out of into boots.  His swelling is gone at this point.  He does still have a lot of dry scaling skin throughout the right lower leg.  Current Outpatient Medications  Medication Sig Dispense Refill   ALPRAZolam  (XANAX ) 0.5 MG tablet Take 1 tablet 1 hour before procedure then take 2nd tablet once arrived at the office 2 tablet 0   amoxicillin  (AMOXIL ) 875 MG tablet Take 1 tablet (875 mg total) by mouth 2 (two) times daily. 20 tablet 0   aspirin 81 MG tablet Take 81 mg by mouth daily.     hydrOXYzine (ATARAX) 25 MG tablet Take by mouth.     sildenafil (REVATIO) 20 MG tablet Take 20 mg by mouth 3 (three) times daily.     sulfamethoxazole -trimethoprim  (BACTRIM  DS) 800-160 MG tablet Take 1 tablet by mouth 2 (two) times daily. 20 tablet 0   torsemide (DEMADEX) 5 MG tablet Take 5 mg by mouth.     valACYclovir (VALTREX) 500 MG tablet Take 500 mg by mouth 2 (two) times daily.     No current facility-administered medications for this visit.   Facility-Administered Medications Ordered in Other Visits  Medication Dose Route Frequency Provider Last Rate Last Admin   ePHEDrine  injection    Anesthesia Intra-op Cook-Martin, Cindy   5 mg at 10/07/14 0815   fentaNYL  (SUBLIMAZE ) injection    Anesthesia Intra-op Cook-Martin, Cindy   50 mcg at 10/07/14 0815   midazolam  (VERSED ) injection    Anesthesia Intra-op Cook-Martin, Cindy   1 mg at 10/07/14 0815   propofol  (DIPRIVAN ) infusion 10 mg/ml EMUL    Continuous PRN Cook-Martin, Cindy   Stopped at 10/07/14 0830    No past medical history on  file.  Past Surgical History:  Procedure Laterality Date   COLONOSCOPY WITH PROPOFOL  N/A 10/07/2014   Procedure: COLONOSCOPY WITH PROPOFOL ;  Surgeon: Lamar ONEIDA Holmes, MD;  Location: [facility] ENDOSCOPY;  Service: Endoscopy;  Laterality: N/A;   HERNIA REPAIR       Social History   Tobacco Use   Smoking status: Former  Substance Use Topics   Alcohol use: Yes    Comment: weekends      No family history on file.   No Known Allergies   REVIEW OF SYSTEMS (Negative unless checked)   Constitutional: [] Weight loss  [] Fever  [] Chills Cardiac: [] Chest pain   [] Chest pressure   [] Palpitations   [] Shortness of breath when laying flat   [] Shortness of breath at rest   [] Shortness of breath with exertion. Vascular:  [] Pain in legs with walking   [] Pain in legs at rest   [] Pain in legs when laying flat   [] Claudication   [] Pain in feet when walking  [] Pain in feet at rest  [] Pain in feet when laying flat   [] History of DVT   [] Phlebitis   [x] Swelling in legs   [] Varicose veins   [x] Non-healing ulcers Pulmonary:   [] Uses home oxygen   [] Productive cough   [] Hemoptysis   [] Wheeze  [] COPD   []   Asthma Neurologic:  [] Dizziness  [] Blackouts   [] Seizures   [] History of stroke   [] History of TIA  [] Aphasia   [] Temporary blindness   [] Dysphagia   [] Weakness or numbness in arms   [] Weakness or numbness in legs Musculoskeletal:  [] Arthritis   [] Joint swelling   [] Joint pain   [] Low back pain Hematologic:  [] Easy bruising  [] Easy bleeding   [] Hypercoagulable state   [] Anemic   Gastrointestinal:  [] Blood in stool   [] Vomiting blood  [] Gastroesophageal reflux/heartburn   [] Abdominal pain Genitourinary:  [] Chronic kidney disease   [] Difficult urination  [] Frequent urination  [] Burning with urination   [] Hematuria Skin:  [] Rashes   [x] Ulcers   [x] Wounds Psychological:  [] History of anxiety   []  History of depression   Physical Examination  BP 135/81   Pulse 81   Resp 17   Wt 206 lb 9.6 oz (93.7 kg)   BMI  25.15 kg/m  Gen:  WD/WN, NAD appears younger than stated age. Head: [place]/AT, No temporalis wasting. Ear/Nose/Throat: Hearing grossly intact, nares w/o erythema or drainage Eyes: Conjunctiva clear. Sclera non-icteric Neck: Supple.  Trachea midline Pulmonary:  Good air movement, no use of accessory muscles.  Cardiac: RRR, no JVD Vascular:  Vessel Right Left  Radial Palpable Palpable           Musculoskeletal: M/S 5/5 throughout.  No deformity or atrophy.  Dry scaling skin is present throughout the right medial lower leg but the ulcer has healed.  Moderate stasis dermatitis changes.  No significant edema. Neurologic: Sensation grossly intact in extremities.  Symmetrical.  Speech is fluent.  Psychiatric: Judgment intact, Mood & affect appropriate for pt's clinical situation. Dermatologic: No rashes or ulcers noted.  No cellulitis or open wounds.      Labs Recent Results (from the past 2160 hours)  Aerobic culture     Status: Abnormal   Collection Time: 12/14/23  9:45 AM  Result Value Ref Range   Aerobic Bacterial Culture Final report (A)    Organism ID, Bacteria Comment (A)     Comment: Beta hemolytic Streptococcus, group B Heavy growth Penicillin and ampicillin are drugs of choice for treatment of beta-hemolytic streptococcal infections. Susceptibility testing of penicillins and other beta-lactam agents approved by the FDA for treatment of beta-hemolytic streptococcal infections need not be performed routinely because nonsusceptible isolates are extremely rare in any beta-hemolytic streptococcus and have not been reported for Streptococcus pyogenes (group A). (CLSI)    Organism ID, Bacteria Routine flora     Comment: Scant growth    Radiology No results found.  Assessment/Plan  Varicose veins of lower extremities with ulcer (HCC) Ulcer has now healed.  Successful laser ablation.  Recommend he wear compression socks daily and we are not going to go back in Unna boots  without ulceration or swelling.  Moisturizers have been recommended to help get rid of the dry scaling skin on the right lower leg.  Follow-up in several months.  Lower limb ulcer, ankle, right, limited to breakdown of skin (HCC) Wound has now healed.  Compression sock instead of Unna boots.    Selinda Gu, MD  02/21/2024 11:47 AM    This note was created with Dragon medical transcription system.  Any errors from dictation are purely unintentional

## 2024-02-21 NOTE — Assessment & Plan Note (Signed)
 Wound has now healed.  Compression sock instead of Unna boots.

## 2024-02-21 NOTE — Assessment & Plan Note (Signed)
 Ulcer has now healed.  Successful laser ablation.  Recommend he wear compression socks daily and we are not going to go back in Unna boots without ulceration or swelling.  Moisturizers have been recommended to help get rid of the dry scaling skin on the right lower leg.  Follow-up in several months.

## 2024-05-22 ENCOUNTER — Ambulatory Visit (INDEPENDENT_AMBULATORY_CARE_PROVIDER_SITE_OTHER): Admitting: Vascular Surgery
